# Patient Record
Sex: Female | Born: 1975 | Race: White | Hispanic: No | Marital: Married | Smoking: Never smoker
Health system: Southern US, Community
[De-identification: ages and names within clinical notes are randomized; demographics above are authoritative.]

## PROBLEM LIST (undated history)

## (undated) DIAGNOSIS — E559 Vitamin D deficiency, unspecified: Secondary | ICD-10-CM

## (undated) DIAGNOSIS — Z87440 Personal history of urinary (tract) infections: Secondary | ICD-10-CM

## (undated) DIAGNOSIS — I10 Essential (primary) hypertension: Secondary | ICD-10-CM

## (undated) DIAGNOSIS — T7840XA Allergy, unspecified, initial encounter: Secondary | ICD-10-CM

## (undated) DIAGNOSIS — B019 Varicella without complication: Secondary | ICD-10-CM

## (undated) DIAGNOSIS — M1990 Unspecified osteoarthritis, unspecified site: Secondary | ICD-10-CM

## (undated) HISTORY — DX: Allergy, unspecified, initial encounter: T78.40XA

## (undated) HISTORY — DX: Unspecified osteoarthritis, unspecified site: M19.90

## (undated) HISTORY — DX: Personal history of urinary (tract) infections: Z87.440

## (undated) HISTORY — DX: Varicella without complication: B01.9

## (undated) HISTORY — DX: Essential (primary) hypertension: I10

## (undated) HISTORY — PX: WISDOM TOOTH EXTRACTION: SHX21

## (undated) HISTORY — DX: Vitamin D deficiency, unspecified: E55.9

---

## 1994-12-05 HISTORY — PX: TONSILLECTOMY AND ADENOIDECTOMY: SUR1326

## 2013-12-05 HISTORY — PX: ABDOMINAL HYSTERECTOMY: SHX81

## 2018-12-13 ENCOUNTER — Other Ambulatory Visit: Payer: Self-pay | Admitting: Physical Medicine and Rehabilitation

## 2018-12-13 DIAGNOSIS — M47812 Spondylosis without myelopathy or radiculopathy, cervical region: Secondary | ICD-10-CM

## 2018-12-25 ENCOUNTER — Other Ambulatory Visit: Payer: Self-pay

## 2018-12-25 ENCOUNTER — Ambulatory Visit
Admission: RE | Admit: 2018-12-25 | Discharge: 2018-12-25 | Disposition: A | Discharge status: Home / Self Care | Payer: BLUE CROSS/BLUE SHIELD | Source: Ambulatory Visit | Attending: Physical Medicine and Rehabilitation | Admitting: Physical Medicine and Rehabilitation

## 2018-12-25 DIAGNOSIS — M47812 Spondylosis without myelopathy or radiculopathy, cervical region: Secondary | ICD-10-CM

## 2018-12-25 MED ORDER — IOPAMIDOL (ISOVUE-M 300) INJECTION 61%
1.0000 mL | Freq: Once | INTRAMUSCULAR | Status: AC | PRN
  Administered 2018-12-25: 1 mL via EPIDURAL

## 2018-12-25 MED ORDER — TRIAMCINOLONE ACETONIDE 40 MG/ML IJ SUSP (RADIOLOGY)
60.0000 mg | Freq: Once | INTRAMUSCULAR | Status: AC
  Administered 2018-12-25: 60 mg via EPIDURAL

## 2018-12-25 NOTE — Discharge Instructions (Signed)

## 2019-02-04 DIAGNOSIS — Z72820 Sleep deprivation: Secondary | ICD-10-CM | POA: Diagnosis not present

## 2019-02-04 DIAGNOSIS — N951 Menopausal and female climacteric states: Secondary | ICD-10-CM | POA: Diagnosis not present

## 2019-02-04 DIAGNOSIS — R5383 Other fatigue: Secondary | ICD-10-CM | POA: Diagnosis not present

## 2019-02-04 DIAGNOSIS — E559 Vitamin D deficiency, unspecified: Secondary | ICD-10-CM | POA: Diagnosis not present

## 2019-02-12 ENCOUNTER — Other Ambulatory Visit: Payer: Self-pay | Admitting: Physical Medicine and Rehabilitation

## 2019-02-12 DIAGNOSIS — M542 Cervicalgia: Secondary | ICD-10-CM

## 2019-02-22 ENCOUNTER — Other Ambulatory Visit: Payer: BLUE CROSS/BLUE SHIELD

## 2019-03-04 DIAGNOSIS — N951 Menopausal and female climacteric states: Secondary | ICD-10-CM | POA: Diagnosis not present

## 2019-03-04 DIAGNOSIS — R6882 Decreased libido: Secondary | ICD-10-CM | POA: Diagnosis not present

## 2019-03-04 DIAGNOSIS — Z72820 Sleep deprivation: Secondary | ICD-10-CM | POA: Diagnosis not present

## 2019-03-06 DIAGNOSIS — Z72820 Sleep deprivation: Secondary | ICD-10-CM | POA: Diagnosis not present

## 2019-03-06 DIAGNOSIS — N951 Menopausal and female climacteric states: Secondary | ICD-10-CM | POA: Diagnosis not present

## 2019-03-06 DIAGNOSIS — R5383 Other fatigue: Secondary | ICD-10-CM | POA: Diagnosis not present

## 2019-03-26 ENCOUNTER — Other Ambulatory Visit: Payer: BLUE CROSS/BLUE SHIELD

## 2019-05-28 ENCOUNTER — Other Ambulatory Visit: Payer: Self-pay

## 2019-05-28 ENCOUNTER — Ambulatory Visit
Admission: RE | Admit: 2019-05-28 | Discharge: 2019-05-28 | Disposition: A | Discharge status: Home / Self Care | Payer: BLUE CROSS/BLUE SHIELD | Source: Ambulatory Visit | Attending: Physical Medicine and Rehabilitation | Admitting: Physical Medicine and Rehabilitation

## 2019-05-28 DIAGNOSIS — M47812 Spondylosis without myelopathy or radiculopathy, cervical region: Secondary | ICD-10-CM | POA: Diagnosis not present

## 2019-05-28 DIAGNOSIS — M542 Cervicalgia: Secondary | ICD-10-CM

## 2019-05-28 MED ORDER — TRIAMCINOLONE ACETONIDE 40 MG/ML IJ SUSP (RADIOLOGY)
60.0000 mg | Freq: Once | INTRAMUSCULAR | Status: AC
  Administered 2019-05-28: 60 mg via EPIDURAL

## 2019-05-28 MED ORDER — IOPAMIDOL (ISOVUE-M 300) INJECTION 61%
1.0000 mL | Freq: Once | INTRAMUSCULAR | Status: AC
  Administered 2019-05-28: 1 mL via EPIDURAL

## 2019-05-31 DIAGNOSIS — R5383 Other fatigue: Secondary | ICD-10-CM | POA: Diagnosis not present

## 2019-05-31 DIAGNOSIS — Z72820 Sleep deprivation: Secondary | ICD-10-CM | POA: Diagnosis not present

## 2019-05-31 DIAGNOSIS — N951 Menopausal and female climacteric states: Secondary | ICD-10-CM | POA: Diagnosis not present

## 2019-05-31 DIAGNOSIS — E559 Vitamin D deficiency, unspecified: Secondary | ICD-10-CM | POA: Diagnosis not present

## 2019-06-04 DIAGNOSIS — N951 Menopausal and female climacteric states: Secondary | ICD-10-CM | POA: Diagnosis not present

## 2019-06-04 DIAGNOSIS — R6882 Decreased libido: Secondary | ICD-10-CM | POA: Diagnosis not present

## 2019-06-04 DIAGNOSIS — E559 Vitamin D deficiency, unspecified: Secondary | ICD-10-CM | POA: Diagnosis not present

## 2019-06-04 DIAGNOSIS — Z72820 Sleep deprivation: Secondary | ICD-10-CM | POA: Diagnosis not present

## 2019-06-10 DIAGNOSIS — N993 Prolapse of vaginal vault after hysterectomy: Secondary | ICD-10-CM | POA: Diagnosis not present

## 2019-06-10 DIAGNOSIS — N393 Stress incontinence (female) (male): Secondary | ICD-10-CM | POA: Diagnosis not present

## 2019-06-25 DIAGNOSIS — N393 Stress incontinence (female) (male): Secondary | ICD-10-CM | POA: Diagnosis not present

## 2019-07-08 DIAGNOSIS — K5900 Constipation, unspecified: Secondary | ICD-10-CM | POA: Diagnosis not present

## 2019-07-08 DIAGNOSIS — R3915 Urgency of urination: Secondary | ICD-10-CM | POA: Diagnosis not present

## 2019-07-08 DIAGNOSIS — N393 Stress incontinence (female) (male): Secondary | ICD-10-CM | POA: Diagnosis not present

## 2019-09-03 DIAGNOSIS — R5383 Other fatigue: Secondary | ICD-10-CM | POA: Diagnosis not present

## 2019-09-03 DIAGNOSIS — N951 Menopausal and female climacteric states: Secondary | ICD-10-CM | POA: Diagnosis not present

## 2019-09-03 DIAGNOSIS — R6882 Decreased libido: Secondary | ICD-10-CM | POA: Diagnosis not present

## 2019-09-03 DIAGNOSIS — Z72820 Sleep deprivation: Secondary | ICD-10-CM | POA: Diagnosis not present

## 2019-09-05 DIAGNOSIS — R6882 Decreased libido: Secondary | ICD-10-CM | POA: Diagnosis not present

## 2019-09-05 DIAGNOSIS — L709 Acne, unspecified: Secondary | ICD-10-CM | POA: Diagnosis not present

## 2019-09-05 DIAGNOSIS — N951 Menopausal and female climacteric states: Secondary | ICD-10-CM | POA: Diagnosis not present

## 2019-09-05 DIAGNOSIS — R5383 Other fatigue: Secondary | ICD-10-CM | POA: Diagnosis not present

## 2019-09-07 DIAGNOSIS — Z20828 Contact with and (suspected) exposure to other viral communicable diseases: Secondary | ICD-10-CM | POA: Diagnosis not present

## 2019-10-02 ENCOUNTER — Ambulatory Visit: Payer: BC Managed Care – PPO | Admitting: Family Medicine

## 2019-10-02 ENCOUNTER — Other Ambulatory Visit: Payer: Self-pay

## 2019-10-02 ENCOUNTER — Encounter: Payer: Self-pay | Admitting: Family Medicine

## 2019-10-02 VITALS — BP 174/102 | HR 68 | Temp 98.6°F | Resp 17 | Ht 68.5 in | Wt 193.5 lb

## 2019-10-02 DIAGNOSIS — Z131 Encounter for screening for diabetes mellitus: Secondary | ICD-10-CM

## 2019-10-02 DIAGNOSIS — Z13220 Encounter for screening for lipoid disorders: Secondary | ICD-10-CM

## 2019-10-02 DIAGNOSIS — R030 Elevated blood-pressure reading, without diagnosis of hypertension: Secondary | ICD-10-CM

## 2019-10-02 DIAGNOSIS — R635 Abnormal weight gain: Secondary | ICD-10-CM

## 2019-10-02 DIAGNOSIS — Z130 Encounter for screening for diseases of the blood and blood-forming organs and certain disorders involving the immune mechanism: Secondary | ICD-10-CM

## 2019-10-02 DIAGNOSIS — Z0000 Encounter for general adult medical examination without abnormal findings: Secondary | ICD-10-CM | POA: Diagnosis not present

## 2019-10-02 DIAGNOSIS — Z79890 Hormone replacement therapy (postmenopausal): Secondary | ICD-10-CM

## 2019-10-02 DIAGNOSIS — Z23 Encounter for immunization: Secondary | ICD-10-CM

## 2019-10-02 DIAGNOSIS — E559 Vitamin D deficiency, unspecified: Secondary | ICD-10-CM | POA: Diagnosis not present

## 2019-10-02 LAB — COMPREHENSIVE METABOLIC PANEL
ALT: 20 U/L (ref 0–35)
AST: 21 U/L (ref 0–37)
Albumin: 4.5 g/dL (ref 3.5–5.2)
Alkaline Phosphatase: 51 U/L (ref 39–117)
BUN: 15 mg/dL (ref 6–23)
CO2: 24 mEq/L (ref 19–32)
Calcium: 9.3 mg/dL (ref 8.4–10.5)
Chloride: 103 mEq/L (ref 96–112)
Creatinine, Ser: 0.77 mg/dL (ref 0.40–1.20)
GFR: 81.3 mL/min (ref >60.00)
Glucose, Bld: 75 mg/dL (ref 70–99)
Potassium: 4.3 mEq/L (ref 3.5–5.1)
Sodium: 137 mEq/L (ref 135–145)
Total Bilirubin: 1.7 mg/dL — ABNORMAL HIGH (ref 0.2–1.2)
Total Protein: 7 g/dL (ref 6.0–8.3)

## 2019-10-02 LAB — LIPID PANEL
Cholesterol: 281 mg/dL — ABNORMAL HIGH (ref 0–200)
HDL: 67.6 mg/dL (ref >39.00)
LDL Cholesterol: 201 mg/dL — ABNORMAL HIGH (ref 0–99)
NonHDL: 212.93
Total CHOL/HDL Ratio: 4
Triglycerides: 61 mg/dL (ref 0.0–149.0)
VLDL: 12.2 mg/dL (ref 0.0–40.0)

## 2019-10-02 LAB — CBC
HCT: 44.3 % (ref 36.0–46.0)
Hemoglobin: 14.9 g/dL (ref 12.0–15.0)
MCHC: 33.7 g/dL (ref 30.0–36.0)
MCV: 93.7 fl (ref 78.0–100.0)
Platelets: 278 10*3/uL (ref 150.0–400.0)
RBC: 4.73 Mil/uL (ref 3.87–5.11)
RDW: 13.3 % (ref 11.5–15.5)
WBC: 7.1 10*3/uL (ref 4.0–10.5)

## 2019-10-02 NOTE — Progress Notes (Signed)
Patient ID: Tammy Estes, female  DOB: January 31, 1976, 43 y.o.   MRN: [id number] Patient Care Team    Relationship Specialty Notifications Start End  Ma Hillock, DO PCP - General Family Medicine  10/02/19   Rockey Situ  Obstetrics and Gynecology  10/02/19     Chief Complaint  Patient presents with  . Establish Care    Prior PCP [facility] in [facility] [place] [facility], [place].   [facility] for neck injury. Pt has not had CPE in a year. Pt has not had pap since hysterectomy and has not had mammogram.     Subjective:  Tammy Estes is a 43 y.o.  female present for new patient establishment. All past medical history, surgical history, allergies, family history, immunizations, medications and social history were updated in the electronic medical record today. All recent labs, ED visits and hospitalizations within the last year were reviewed.  Health maintenance:  Colonoscopy: No fhx. Routine screen at 49 Mammogram: pt declined screening. She is on HRT therapy through [facility]- discussed it is recommended she have yearly mammograms, esp. While on HRT therapy d/y increased potential risk.  Cervical cancer screening: hysterectomy for adenomyosis and endometriosis. She had a pelvic exam with urogyn. 07/08/2019  Immunizations: tdap update today, Influenza declined (encouraged yearly) Infectious disease screening: HIV completed DEXA: routine screen.  Assistive device: none Oxygen VOH:YWVP Patient has a Dental home. Hospitalizations/ED visits:reviewed  Depression screen [facility] 2/9 10/02/2019  Decreased Interest 0  Down, Depressed, Hopeless 0  PHQ - 2 Score 0   No flowsheet data found.     No flowsheet data found. Immunization History  Administered Date(s) Administered  . Tdap 10/02/2019    No exam data present  Past Medical History:  Diagnosis Date  . Allergy   . Arthritis   . Chicken pox   . History of frequent urinary tract infections   .  Hypertension   . Vitamin D deficiency    No Known Allergies Past Surgical History:  Procedure Laterality Date  . ABDOMINAL HYSTERECTOMY  2015   partial for adenomyosis and endometriosis  . TONSILLECTOMY AND ADENOIDECTOMY  1996  . WISDOM TOOTH EXTRACTION     Family History  Problem Relation Age of Onset  . Stroke Father   . Heart disease Father   . Dementia Father   . Hypertension Father   . Hyperlipidemia Father   . Diabetes Father   . Autism Brother   . Hyperlipidemia Maternal Grandmother   . Hypertension Maternal Grandmother   . Stroke Maternal Grandfather   . Parkinson's disease Maternal Grandfather   . Pancreatic cancer Maternal Grandfather   . Stroke Paternal Grandmother   . Heart attack Paternal Grandmother    Social History   Social History Narrative   Marital status/children/pets: married, 2 children.    Education/employment: employeed, realtor   Safety:      -smoke alarm in the home:Yes     - wears seatbelt: Yes     - Feels safe in their relationships: Yes    Allergies as of 10/02/2019   No Known Allergies     Medication List       Accurate as of October 02, 2019 12:36 PM. If you have any questions, ask your nurse or doctor.        Collagen Hydrolysate (Bovine) Powd by Does not apply route.   Magnesium 200 MG Tabs Take 1 tablet by mouth.   progesterone 200 MG capsule Commonly  known as: PROMETRIUM Take 225 mg by mouth daily.   spironolactone 25 MG tablet Commonly known as: ALDACTONE Take 25 mg by mouth daily.   Testosterone 100 MG Pllt 125 mg by Implant route.   Vitamin D 125 MCG (5000 UT) Caps Take 1 tablet by mouth.   WOMENS MULTI VITAMIN & MINERAL PO Take by mouth.   HAIR VITAMINS PO Take 1 tablet by mouth.       All past medical history, surgical history, allergies, family history, immunizations andmedications were updated in the EMR today and reviewed under the history and medication portions of their EMR.    No results  found for this or any previous visit (from the past 2160 hour(s)).  ROS: 14 pt review of systems performed and negative (unless mentioned in an HPI)  Objective: BP (!) 174/102 (BP Location: Right Arm, Patient Position: Sitting, Cuff Size: Normal)   Pulse 68   Temp 98.6 F (37 C) (Temporal)   Resp 17   Ht 5' 8.5" (1.74 m)   Wt 193 lb 8 oz (87.8 kg)   SpO2 100%   BMI 28.99 kg/m  Gen: Afebrile. No acute distress. Nontoxic in appearance, well-developed, well-nourished, pleasant overweight female.  HENT: AT. [place]. Bilateral TM visualized and normal in appearance, normal external auditory canal. MMM, no oral lesions, adequate dentition. Bilateral nares within normal limits. Throat without erythema, ulcerations or exudates. no Cough on exam, no hoarseness on exam. Eyes:Pupils Equal Round Reactive to light, Extraocular movements intact,  Conjunctiva without redness, discharge or icterus. Neck/lymp/endocrine: Supple,no lymphadenopathy, no thyromegaly CV: RRR no murmur, no edema, +2/4 P posterior tibialis pulses. no carotid bruits. No JVD. Chest: CTAB, no wheeze, rhonchi or crackles. normal Respiratory effort. good Air movement. Abd: Soft. flat. NTND. BS present. no Masses palpated. No hepatosplenomegaly. No rebound tenderness or guarding. Skin: no rashes, purpura or petechiae. Warm and well-perfused. Skin intact. Neuro/Msk: Normal gait. PERLA. EOMi. Alert. Oriented x3.  Cranial nerves II through XII intact. Muscle strength 5/5 upper/lower extremity. DTRs equal bilaterally. Psych: Normal affect, dress and demeanor. Normal speech. Normal thought content and judgment.   Assessment/plan: Tammy Estes is a 43 y.o. female present for est/cpe Vitamin D deficiency Taking vit d 5000u daily.  - Vitamin D (25 hydroxy Screening for deficiency anemia - CBC Diabetes mellitus screening - Hemoglobin A1c Screening cholesterol level - Lipid panel Hormone replacement therapy (HRT)/Weight gain - HRT  managed by [facility].  - Continue to working on diet and exercise. Reports about a 20lb weight gain with covid.  - CBC - Comp Met (CMET) - TSH - Lipid panel  Need for tetanus booster - Tdap vaccine greater than or equal to 7yo IM  Elevated BP without diagnosis of hypertension Pt reports she has WCS and BP is always high at appts. She was once placed on a BP med and then had hypotension/syncope.  Monitor at home, if > 135/85 routinely would need to have her follow up and consider restart of med. Await her records to review history and prior meds  Encounter for preventive health examination Patient was encouraged to exercise greater than 150 minutes a week. Patient was encouraged to choose a diet filled with fresh fruits and vegetables, and lean meats. AVS provided to patient today for education/recommendation on gender specific health and safety maintenance. Colonoscopy: No fhx. Routine screen at 49 Mammogram: pt declined screening. She is on H[facility] therapy [facility]- discussed it is recommended she have yearly mammograms, esp.  While on HRT therapy d/y increased potential risk.  Cervical cancer screening: hysterectomy for adenomyosis and endometriosis. She had a pelvic exam with urogyn. 07/08/2019  Immunizations: tdap update today, Influenza declined (encouraged yearly) Infectious disease screening: HIV completed DEXA: routine screen.   Return in about 1 year (around 10/01/2020) for CPE (30 min).   Note is dictated utilizing voice recognition software. Although note has been proof read prior to signing, occasional typographical errors still can be missed. If any questions arise, please do not hesitate to call for verification.  Electronically signed by: Howard Pouch, DO [place]

## 2019-10-02 NOTE — Patient Instructions (Signed)
Dr. Oswaldo Conroy in [place] is a local eye doctor.   Health Maintenance, Female Adopting a healthy lifestyle and getting preventive care are important in promoting health and wellness. Ask your health care provider about:  The right schedule for you to have regular tests and exams.  Things you can do on your own to prevent diseases and keep yourself healthy. What should I know about diet, weight, and exercise? Eat a healthy diet   Eat a diet that includes plenty of vegetables, fruits, low-fat dairy products, and lean protein.  Do not eat a lot of foods that are high in solid fats, added sugars, or sodium. Maintain a healthy weight Body mass index (BMI) is used to identify weight problems. It estimates body fat based on height and weight. Your health care provider can help determine your BMI and help you achieve or maintain a healthy weight. Get regular exercise Get regular exercise. This is one of the most important things you can do for your health. Most adults should:  Exercise for at least 150 minutes each week. The exercise should increase your heart rate and make you sweat (moderate-intensity exercise).  Do strengthening exercises at least twice a week. This is in addition to the moderate-intensity exercise.  Spend less time sitting. Even light physical activity can be beneficial. Watch cholesterol and blood lipids Have your blood tested for lipids and cholesterol at 44 years of age, then have this test every 5 years. Have your cholesterol levels checked more often if:  Your lipid or cholesterol levels are high.  You are older than 44 years of age.  You are at high risk for heart disease. What should I know about cancer screening? Depending on your health history and family history, you may need to have cancer screening at various ages. This may include screening for:  Breast cancer.  Cervical cancer.  Colorectal cancer.  Skin cancer.  Lung cancer. What should I know  about heart disease, diabetes, and high blood pressure? Blood pressure and heart disease  High blood pressure causes heart disease and increases the risk of stroke. This is more likely to develop in people who have high blood pressure readings, are of African descent, or are overweight.  Have your blood pressure checked: ? Every 3-5 years if you are 62-21 years of age. ? Every year if you are 1 years old or older. Diabetes Have regular diabetes screenings. This checks your fasting blood sugar level. Have the screening done:  Once every three years after age 43 if you are at a normal weight and have a low risk for diabetes.  More often and at a younger age if you are overweight or have a high risk for diabetes. What should I know about preventing infection? Hepatitis B If you have a higher risk for hepatitis B, you should be screened for this virus. Talk with your health care provider to find out if you are at risk for hepatitis B infection. Hepatitis C Testing is recommended for:  Everyone born from [birth year] through 1966.  Anyone with known risk factors for hepatitis C. Sexually transmitted infections (STIs)  Get screened for STIs, including gonorrhea and chlamydia, if: ? You are sexually active and are younger than 44 years of age. ? You are older than 44 years of age and your health care provider tells you that you are at risk for this type of infection. ? Your sexual activity has changed since you were last screened, and you are at increased  risk for chlamydia or gonorrhea. Ask your health care provider if you are at risk.  Ask your health care provider about whether you are at high risk for HIV. Your health care provider may recommend a prescription medicine to help prevent HIV infection. If you choose to take medicine to prevent HIV, you should first get tested for HIV. You should then be tested every 3 months for as long as you are taking the medicine. Pregnancy  If you are about  to stop having your period (premenopausal) and you may become pregnant, seek counseling before you get pregnant.  Take 400 to 800 micrograms (mcg) of folic acid every day if you become pregnant.  Ask for birth control (contraception) if you want to prevent pregnancy. Osteoporosis and menopause Osteoporosis is a disease in which the bones lose minerals and strength with aging. This can result in bone fractures. If you are 13 years old or older, or if you are at risk for osteoporosis and fractures, ask your health care provider if you should:  Be screened for bone loss.  Take a calcium or vitamin D supplement to lower your risk of fractures.  Be given hormone replacement therapy (HRT) to treat symptoms of menopause. Follow these instructions at home: Lifestyle  Do not use any products that contain nicotine or tobacco, such as cigarettes, e-cigarettes, and chewing tobacco. If you need help quitting, ask your health care provider.  Do not use street drugs.  Do not share needles.  Ask your health care provider for help if you need support or information about quitting drugs. Alcohol use  Do not drink alcohol if: ? Your health care provider tells you not to drink. ? You are pregnant, may be pregnant, or are planning to become pregnant.  If you drink alcohol: ? Limit how much you use to 0-1 drink a day. ? Limit intake if you are breastfeeding.  Be aware of how much alcohol is in your drink. In the [place] one drink equals one 12 oz bottle of beer (355 mL), one 5 oz glass of wine (148 mL), or one 1 oz glass of hard liquor (44 mL). General instructions  Schedule regular health, dental, and eye exams.  Stay current with your vaccines.  Tell your health care provider if: ? You often feel depressed. ? You have ever been abused or do not feel safe at home. Summary  Adopting a healthy lifestyle and getting preventive care are important in promoting health and wellness.  Follow your  health care provider's instructions about healthy diet, exercising, and getting tested or screened for diseases.  Follow your health care provider's instructions on monitoring your cholesterol and blood pressure. This information is not intended to replace advice given to you by your health care provider. Make sure you discuss any questions you have with your health care provider. Document Released: 06/06/2011 Document Revised: 11/14/2018 Document Reviewed: 11/14/2018 Elsevier Patient Education  2020 [employer].

## 2019-10-03 LAB — HEMOGLOBIN A1C: Hgb A1c MFr Bld: 5.2 % (ref 4.6–6.5)

## 2019-10-03 LAB — VITAMIN D 25 HYDROXY (VIT D DEFICIENCY, FRACTURES): VITD: 41.52 ng/mL (ref 30.00–100.00)

## 2019-10-03 LAB — TSH: TSH: 0.97 u[IU]/mL (ref 0.35–4.50)

## 2019-10-04 ENCOUNTER — Telehealth: Payer: Self-pay | Admitting: Family Medicine

## 2019-10-04 DIAGNOSIS — E785 Hyperlipidemia, unspecified: Secondary | ICD-10-CM

## 2019-10-04 DIAGNOSIS — R17 Unspecified jaundice: Secondary | ICD-10-CM

## 2019-10-04 MED ORDER — ATORVASTATIN CALCIUM 40 MG PO TABS: 40.0000 mg | ORAL_TABLET | Freq: Every day | ORAL | 3 refills | Status: DC

## 2019-10-04 NOTE — Telephone Encounter (Signed)
Please inform patient the following information: -Blood counts, vitamin D, thyroid and diabetes/A1c levels are all normal. -Her bilirubin was elevated, this is sometimes commonly seen in people who are fasting. I do not have any prior labs to compare. Has she been told this in the past? Repeat bilirubin lab in 2 weeks (lab appointment only)>> just must EAT before lab collected, fasting can cause elevation in some people>> if it corrects once eating it is not a concern.  -Her cholesterol is extremely elevated with a total cholesterol of 281 (goal less than 200), HDL/good cholesterol at 67 which is great, and her LDL/bad cholesterol which is extremely elevated at 201  (goal at least less than 130). This puts her at a higher risk for heart attack and stroke. With her family history in the elevated LDL, it is strongly encouraged for her to start a statin medication.  This medication brings down the cholesterol and it also provide some cardiovascular protection against stroke and heart attack.  I have called this in for her to start to take nightly.  Follow-up in 12 weeks, fasting appointment with provider for recheck.   Increasing exercise and following a Mediterranean diet can also be helpful.  Lastly, I would like her to monitor her blood pressure at home and if routinely seeing greater than 135/85 to call and make [place] aware, we would then start a blood pressure medication as well.

## 2019-10-04 NOTE — Telephone Encounter (Signed)
Pt was called and given all information and results. She agreed to start Statin and take BP at home. Lab appt was scheduled but patient said she would have to call back for the 12 week fasting appt.

## 2019-10-06 DIAGNOSIS — R17 Unspecified jaundice: Secondary | ICD-10-CM

## 2019-10-06 HISTORY — DX: Unspecified jaundice: R17

## 2019-10-17 ENCOUNTER — Ambulatory Visit (INDEPENDENT_AMBULATORY_CARE_PROVIDER_SITE_OTHER): Payer: BC Managed Care – PPO

## 2019-10-17 ENCOUNTER — Other Ambulatory Visit: Payer: Self-pay

## 2019-10-17 DIAGNOSIS — R17 Unspecified jaundice: Secondary | ICD-10-CM | POA: Diagnosis not present

## 2019-10-18 LAB — BILIRUBIN, FRACTIONATED(TOT/DIR/INDIR)
Bilirubin, Direct: 0.2 mg/dL (ref 0.0–0.2)
Indirect Bilirubin: 1 mg/dL (calc) (ref 0.2–1.2)
Total Bilirubin: 1.2 mg/dL (ref 0.2–1.2)

## 2019-10-21 ENCOUNTER — Encounter: Payer: Self-pay | Admitting: Family Medicine

## 2019-12-09 DIAGNOSIS — R6882 Decreased libido: Secondary | ICD-10-CM | POA: Diagnosis not present

## 2019-12-09 DIAGNOSIS — R5383 Other fatigue: Secondary | ICD-10-CM | POA: Diagnosis not present

## 2019-12-09 DIAGNOSIS — N951 Menopausal and female climacteric states: Secondary | ICD-10-CM | POA: Diagnosis not present

## 2019-12-11 DIAGNOSIS — N951 Menopausal and female climacteric states: Secondary | ICD-10-CM | POA: Diagnosis not present

## 2019-12-11 DIAGNOSIS — Z72820 Sleep deprivation: Secondary | ICD-10-CM | POA: Diagnosis not present

## 2019-12-11 DIAGNOSIS — R5383 Other fatigue: Secondary | ICD-10-CM | POA: Diagnosis not present

## 2020-02-14 DIAGNOSIS — M79602 Pain in left arm: Secondary | ICD-10-CM | POA: Diagnosis not present

## 2020-02-14 DIAGNOSIS — M5412 Radiculopathy, cervical region: Secondary | ICD-10-CM | POA: Diagnosis not present

## 2020-02-14 DIAGNOSIS — M79601 Pain in right arm: Secondary | ICD-10-CM | POA: Diagnosis not present

## 2020-02-18 ENCOUNTER — Ambulatory Visit: Payer: BC Managed Care – PPO | Admitting: Family Medicine

## 2020-02-18 ENCOUNTER — Other Ambulatory Visit: Payer: Self-pay

## 2020-02-18 ENCOUNTER — Encounter: Payer: Self-pay | Admitting: Family Medicine

## 2020-02-18 VITALS — BP 138/82 | HR 70 | Temp 98.3°F | Resp 16 | Ht 69.0 in | Wt 210.5 lb

## 2020-02-18 DIAGNOSIS — E782 Mixed hyperlipidemia: Secondary | ICD-10-CM

## 2020-02-18 DIAGNOSIS — F418 Other specified anxiety disorders: Secondary | ICD-10-CM | POA: Diagnosis not present

## 2020-02-18 DIAGNOSIS — E785 Hyperlipidemia, unspecified: Secondary | ICD-10-CM | POA: Insufficient documentation

## 2020-02-18 DIAGNOSIS — Z5320 Procedure and treatment not carried out because of patient's decision for unspecified reasons: Secondary | ICD-10-CM | POA: Insufficient documentation

## 2020-02-18 MED ORDER — ESCITALOPRAM OXALATE 10 MG PO TABS: 10.0000 mg | ORAL_TABLET | Freq: Every day | ORAL | 0 refills | Status: DC

## 2020-02-18 NOTE — Patient Instructions (Signed)
Start lexapro daily.  I have referred you to psychology/therapy >> they will call you to schedule.     Major Depressive Disorder, Adult Major depressive disorder (MDD) is a mental health condition. MDD often makes you feel sad, hopeless, or helpless. MDD can also cause symptoms in your body. MDD can affect your:  Work.  School.  Relationships.  Other normal activities. MDD can range from mild to very bad. It may occur once (single episode MDD). It can also occur many times (recurrent MDD). The main symptoms of MDD often include:  Feeling sad, depressed, or irritable most of the time.  Loss of interest. MDD symptoms also include:  Sleeping too much or too little.  Eating too much or too little.  A change in your weight.  Feeling tired (fatigue) or having low energy.  Feeling worthless.  Feeling guilty.  Trouble making decisions.  Trouble thinking clearly.  Thoughts of suicide or harming others.  Feeling weak.  Feeling agitated.  Keeping yourself from being around other people (isolation). Follow these instructions at home: Activity  Do these things as told by your doctor: ? Go back to your normal activities. ? Exercise regularly. ? Spend time outdoors. Alcohol  Talk with your doctor about how alcohol can affect your antidepressant medicines.  Do not drink alcohol. Or, limit how much alcohol you drink. ? This means no more than 1 drink a day for nonpregnant women and 2 drinks a day for men. One drink equals one of these:  12 oz of beer.  5 oz of wine.  1 oz of hard liquor. General instructions  Take over-the-counter and prescription medicines only as told by your doctor.  Eat a healthy diet.  Get plenty of sleep.  Find activities that you enjoy. Make time to do them.  Think about joining a support group. Your doctor may be able to suggest a group for you.  Keep all follow-up visits as told by your doctor. This is important. Where to find  more information:  [employer] on Mental Illness: ? www.nami.org  [place] [employer] of Mental Health: ? http://www.maynard.net/  National Suicide Prevention Lifeline: ? 7705247529. This is free, 24-hour help. Contact a doctor if:  Your symptoms get worse.  You have new symptoms. Get help right away if:  You self-harm.  You see, hear, taste, smell, or feel things that are not present (hallucinate). If you ever feel like you may hurt yourself or others, or have thoughts about taking your own life, get help right away. You can go to your nearest emergency department or call:  Your local emergency services (911 in the [place]  A suicide crisis helpline, such as the National Suicide Prevention Lifeline: ? 830-155-8463. This is open 24 hours a day. This information is not intended to replace advice given to you by your health care provider. Make sure you discuss any questions you have with your health care provider. Document Revised: 11/03/2017 Document Reviewed: 08/07/2016 Elsevier Patient Education  2020 Else[employer] Inc.   Generalized Anxiety Disorder, Adult Generalized anxiety disorder (GAD) is a mental health disorder. People with this condition constantly worry about everyday events. Unlike normal anxiety, worry related to GAD is not triggered by a specific event. These worries also do not fade or get better with time. GAD interferes with life functions, including relationships, work, and school. GAD can vary from mild to severe. People with severe GAD can have intense waves of anxiety with physical symptoms (panic attacks). What are the  causes? The exact cause of GAD is not known. What increases the risk? This condition is more likely to develop in:  Women.  People who have a family history of anxiety disorders.  People who are very shy.  People who experience very stressful life events, such as the death of a loved one.  People who have a very stressful family  environment. What are the signs or symptoms? People with GAD often worry excessively about many things in their lives, such as their health and family. They may also be overly concerned about:  Doing well at work.  Being on time.  Natural disasters.  Friendships. Physical symptoms of GAD include:  Fatigue.  Muscle tension or having muscle twitches.  Trembling or feeling shaky.  Being easily startled.  Feeling like your heart is pounding or racing.  Feeling out of breath or like you cannot take a deep breath.  Having trouble falling asleep or staying asleep.  Sweating.  Nausea, diarrhea, or irritable bowel syndrome (IBS).  Headaches.  Trouble concentrating or remembering facts.  Restlessness.  Irritability. How is this diagnosed? Your health care provider can diagnose GAD based on your symptoms and medical history. You will also have a physical exam. The health care provider will ask specific questions about your symptoms, including how severe they are, when they started, and if they come and go. Your health care provider may ask you about your use of alcohol or drugs, including prescription medicines. Your health care provider may refer you to a mental health specialist for further evaluation. Your health care provider will do a thorough examination and may perform additional tests to rule out other possible causes of your symptoms. To be diagnosed with GAD, a person must have anxiety that:  Is out of his or her control.  Affects several different aspects of his or her life, such as work and relationships.  Causes distress that makes him or her unable to take part in normal activities.  Includes at least three physical symptoms of GAD, such as restlessness, fatigue, trouble concentrating, irritability, muscle tension, or sleep problems. Before your health care provider can confirm a diagnosis of GAD, these symptoms must be present more days than they are not, and  they must last for six months or longer. How is this treated? The following therapies are usually used to treat GAD:  Medicine. Antidepressant medicine is usually prescribed for long-term daily control. Antianxiety medicines may be added in severe cases, especially when panic attacks occur.  Talk therapy (psychotherapy). Certain types of talk therapy can be helpful in treating GAD by providing support, education, and guidance. Options include: ? Cognitive behavioral therapy (CBT). People learn coping skills and techniques to ease their anxiety. They learn to identify unrealistic or negative thoughts and behaviors and to replace them with positive ones. ? Acceptance and commitment therapy (ACT). This treatment teaches people how to be mindful as a way to cope with unwanted thoughts and feelings. ? Biofeedback. This process trains you to manage your body's response (physiological response) through breathing techniques and relaxation methods. You will work with a therapist while machines are used to monitor your physical symptoms.  Stress management techniques. These include yoga, meditation, and exercise. A mental health specialist can help determine which treatment is best for you. Some people see improvement with one type of therapy. However, other people require a combination of therapies. Follow these instructions at home:  Take over-the-counter and prescription medicines only as told by your health care  provider.  Try to maintain a normal routine.  Try to anticipate stressful situations and allow extra time to manage them.  Practice any stress management or self-calming techniques as taught by your health care provider.  Do not punish yourself for setbacks or for not making progress.  Try to recognize your accomplishments, even if they are small.  Keep all follow-up visits as told by your health care provider. This is important. Contact a health care provider if:  Your symptoms do  not get better.  Your symptoms get worse.  You have signs of depression, such as: ? A persistently sad, cranky, or irritable mood. ? Loss of enjoyment in activities that used to bring you joy. ? Change in weight or eating. ? Changes in sleeping habits. ? Avoiding friends or family members. ? Loss of energy for normal tasks. ? Feelings of guilt or worthlessness. Get help right away if:  You have serious thoughts about hurting yourself or others. If you ever feel like you may hurt yourself or others, or have thoughts about taking your own life, get help right away. You can go to your nearest emergency department or call:  Your local emergency services (911 in the [place]  A suicide crisis helpline, such as the [employer] at 539-588-9305. This is open 24 hours a day. Summary  Generalized anxiety disorder (GAD) is a mental health disorder that involves worry that is not triggered by a specific event.  People with GAD often worry excessively about many things in their lives, such as their health and family.  GAD may cause physical symptoms such as restlessness, trouble concentrating, sleep problems, frequent sweating, nausea, diarrhea, headaches, and trembling or muscle twitching.  A mental health specialist can help determine which treatment is best for you. Some people see improvement with one type of therapy. However, other people require a combination of therapies. This information is not intended to replace advice given to you by your health care provider. Make sure you discuss any questions you have with your health care provider. Document Revised: 11/03/2017 Document Reviewed: 10/11/2016 Elsevier Patient Education  2020 [employer].

## 2020-02-18 NOTE — Progress Notes (Signed)
This visit occurred during the SARS-CoV-2 public health emergency.  Safety protocols were in place, including screening questions prior to the visit, additional usage of staff PPE, and extensive cleaning of exam room while observing appropriate contact time as indicated for disinfecting solutions.    Tammy Estes , 1975/12/11, 44 y.o., female MRN: [id number] Patient Care Team    Relationship Specialty Notifications Start End  Natalia Leatherwood, DO PCP - General Family Medicine  10/02/19   Karleen Dolphin  Obstetrics and Gynecology  10/02/19     Chief Complaint  Patient presents with  . Anxiety    Pt has been having anxiety all her adult life, worsened recently. Neck pain has increasned due to anxiety. Getting MRI next week for neck.      Subjective: Tammy Estes is a 44 y.o.  Female Pt presents for an OV with complaints of anxiety.  Patient reports she has had anxiety for most of her adult life.  She has not been prescribed medications for her anxiety in the past and always had felt like she would attempt to cope with her anxiety.  However she has a lot of increased rest at this time and does not feel she is able to cope with it on her own.  She moved to a new area/date in the middle of a pandemic.  Her son is having signs of depression and she has concerns for him.  She reports she is having more neck pain secondary to the tension in the stress.  She is under care for her neck pain through her specialist.  Depression screen [facility] 2/9 02/18/2020 10/02/2019  Decreased Interest 3 0  Down, Depressed, Hopeless 3 0  PHQ - 2 Score 6 0  Altered sleeping 3 -  Tired, decreased energy 3 -  Change in appetite 3 -  Feeling bad or failure about yourself  3 -  Trouble concentrating 3 -  Moving slowly or fidgety/restless 3 -  Suicidal thoughts 0 -  PHQ-9 Score 24 -  Difficult doing work/chores Very difficult -   GAD 7 : Generalized Anxiety Score 02/18/2020  Nervous, Anxious, on  Edge 3  Control/stop worrying 0  Worry too much - different things 3  Trouble relaxing 3  Restless 0  Easily annoyed or irritable 2  Afraid - awful might happen 0  Total GAD 7 Score 11  Anxiety Difficulty Not difficult at all   No Known Allergies Social History   Social History Narrative   Marital status/children/pets: married, 2 children.    Education/employment: employeed, realtor   Safety:      -smoke alarm in the home:Yes     - wears seatbelt: Yes     - Feels safe in their relationships: Yes   Past Medical History:  Diagnosis Date  . Allergy   . Arthritis   . Chicken pox   . Elevated bilirubin 10/2019   with fasting labs only>> rpt normal.   . History of frequent urinary tract infections   . Hypertension   . Vitamin D deficiency    Past Surgical History:  Procedure Laterality Date  . ABDOMINAL HYSTERECTOMY  2015   partial for adenomyosis and endometriosis  . TONSILLECTOMY AND ADENOIDECTOMY  1996  . WISDOM TOOTH EXTRACTION     Family History  Problem Relation Age of Onset  . Stroke Father   . Heart disease Father   . Dementia Father   . Hypertension Father   . Hyperlipidemia Father   .  Diabetes Father   . Autism Brother   . Hyperlipidemia Maternal Grandmother   . Hypertension Maternal Grandmother   . Stroke Maternal Grandfather   . Parkinson's disease Maternal Grandfather   . Pancreatic cancer Maternal Grandfather   . Stroke Paternal Grandmother   . Heart attack Paternal Grandmother    Allergies as of 02/18/2020   No Known Allergies     Medication List       Accurate as of February 18, 2020 12:24 PM. If you have any questions, ask your nurse or doctor.        STOP taking these medications   atorvastatin 40 MG tablet Commonly known as: LIPITOR Stopped by: Howard Pouch, DO   spironolactone 25 MG tablet Commonly known as: ALDACTONE Stopped by: Howard Pouch, DO     TAKE these medications   Collagen Hydrolysate (Bovine) Powd by Does not apply  route.   escitalopram 10 MG tablet Commonly known as: Lexapro Take 1 tablet (10 mg total) by mouth daily. Started by: Howard Pouch, DO   Magnesium 200 MG Tabs Take 1 tablet by mouth.   progesterone 200 MG capsule Commonly known as: PROMETRIUM Take 225 mg by mouth daily.   Testosterone 100 MG Pllt 125 mg by Implant route.   Vitamin D 125 MCG (5000 UT) Caps Take 1 tablet by mouth.   WOMENS MULTI VITAMIN & MINERAL PO Take by mouth.   HAIR VITAMINS PO Take 1 tablet by mouth.       All past medical history, surgical history, allergies, family history, immunizations andmedications were updated in the EMR today and reviewed under the history and medication portions of their EMR.     ROS: Negative, with the exception of above mentioned in HPI   Objective:  BP 138/82 (BP Location: Left Arm, Patient Position: Sitting, Cuff Size: Normal)   Pulse 70   Temp 98.3 F (36.8 C) (Temporal)   Resp 16   Ht 5\' 9"  (1.753 m)   Wt 210 lb 8 oz (95.5 kg)   SpO2 100%   BMI 31.09 kg/m  Body mass index is 31.09 kg/m. Gen: Afebrile. No acute distress. Nontoxic in appearance, well developed, well nourished.  HENT: AT. [place].  Eyes:Pupils Equal Round Reactive to light, Extraocular movements intact,  Conjunctiva without redness, discharge or icterus. Neuro:  Normal gait. PERLA. EOMi. Alert. Oriented x3 Psych: Tearful. Otherwise, Normal affect, dress and demeanor. Normal speech. Normal thought content and judgment.  No exam data present No results found. No results found for this or any previous visit (from the past 24 hour(s)).  Assessment/Plan: Tammy Estes is a 44 y.o. female present for OV for  Statin declined/Mixed hyperlipidemia Patient was prescribed statin for severely increased cholesterol panel.  She did not start medication.  Discussed with her today she is at extremely high risk for heart attack or stroke with a total cholesterol of 281 and an LDL of 201. Strongly encouraged  her to start the medication, as well as exercise and dietary modifications.  If she does elect to start the medication prescribed at her last visit-she understands to follow-up in 3 months for cholesterol recheck with LFTs. Patient was also educated on hormone replacement therapy can increase cholesterol levels.  Depression with anxiety Multiple different options was provided to patient today and she is agreeable to start both medications and therapy. - Ambulatory referral to Psychology> referral to Dr. Gaynell Face has been placed. -Start Lexapro 10 mg daily.  Follow-up in 4 weeks-if tolerating medication  and higher doses needed will taper off at that time.  Patient reports understanding.   Reviewed expectations re: course of current medical issues.  Discussed self-management of symptoms.  Outlined signs and symptoms indicating need for more acute intervention.  Patient verbalized understanding and all questions were answered.  Patient received an After-Visit Summary.    Orders Placed This Encounter  Procedures  . Ambulatory referral to Psychology   Meds ordered this encounter  Medications  . escitalopram (LEXAPRO) 10 MG tablet    Sig: Take 1 tablet (10 mg total) by mouth daily.    Dispense:  90 tablet    Refill:  0    Referral Orders     Ambulatory referral to Psychology   Note is dictated utilizing voice recognition software. Although note has been proof read prior to signing, occasional typographical errors still can be missed. If any questions arise, please do not hesitate to call for verification.   electronically signed by:  Felix Pacini, DO  [facility] [place]

## 2020-02-21 DIAGNOSIS — M542 Cervicalgia: Secondary | ICD-10-CM | POA: Diagnosis not present

## 2020-02-24 ENCOUNTER — Telehealth: Payer: Self-pay

## 2020-02-24 NOTE — Telephone Encounter (Signed)
Patient thinks she is having med reaction to prescribed meds on 3/16 by Dr. Claiborne Billings.  She doesn't know which of the meds she having the reaction to.  Severe headache, nausea and vomiting  eescitalopram (LEXAPRO) 10 MG tablet   atorvastatin 40 MG tablet (in notes pt was to stop this med)  Patient can be reached at [phone]

## 2020-02-24 NOTE — Telephone Encounter (Signed)
LMOM for pt to CB to discuss.  

## 2020-02-25 DIAGNOSIS — M5412 Radiculopathy, cervical region: Secondary | ICD-10-CM | POA: Diagnosis not present

## 2020-02-25 NOTE — Telephone Encounter (Signed)
Pt took Atorvastatin and Lexapro for the first time yesterday morning. Had N/V/D about 45 mins after taking Atorvastatin and Lexapro. Lasted most of the morning and then she slept all day. Denies fever. Nobody else sick in the home and has not been exposed to COVID.  She felt dizziness, shaky, and lightheaded as well. She only took the Atorvastatin this morning and states she does not feel sick yet. She will continue to take the Atorvastatin each morning unless sees side effects.   Sent to Dr Claiborne Billings to review

## 2020-02-25 NOTE — Telephone Encounter (Signed)
Patient called back yesterday right at 5pm. I told her I would have someone call her back today.   Thank you.

## 2020-02-26 NOTE — Telephone Encounter (Signed)
Pt was called and given all information

## 2020-02-26 NOTE — Telephone Encounter (Signed)
Sorry to hear she was ill.  This could been a acute illness versus a reaction to one of the medications.  If taking atorvastatin without incident I would recommend she continue. She has not tried to take the Lexapro again now that she is feeling improved.  I would encourage her to attempt to take Lexapro again just not at the same time as the atorvastatin.  Headache, nausea and vomiting is not a common constellation of symptoms to occur together with the use of either of these medications.  It truly sounds as if it was an acute illness.

## 2020-02-28 DIAGNOSIS — M5412 Radiculopathy, cervical region: Secondary | ICD-10-CM | POA: Diagnosis not present

## 2020-03-06 DIAGNOSIS — N951 Menopausal and female climacteric states: Secondary | ICD-10-CM | POA: Diagnosis not present

## 2020-03-06 DIAGNOSIS — Z72820 Sleep deprivation: Secondary | ICD-10-CM | POA: Diagnosis not present

## 2020-03-06 DIAGNOSIS — R5383 Other fatigue: Secondary | ICD-10-CM | POA: Diagnosis not present

## 2020-03-10 DIAGNOSIS — N951 Menopausal and female climacteric states: Secondary | ICD-10-CM | POA: Diagnosis not present

## 2020-03-10 DIAGNOSIS — R5383 Other fatigue: Secondary | ICD-10-CM | POA: Diagnosis not present

## 2020-03-10 DIAGNOSIS — R6882 Decreased libido: Secondary | ICD-10-CM | POA: Diagnosis not present

## 2020-03-10 DIAGNOSIS — Z72820 Sleep deprivation: Secondary | ICD-10-CM | POA: Diagnosis not present

## 2020-03-11 DIAGNOSIS — M79601 Pain in right arm: Secondary | ICD-10-CM | POA: Diagnosis not present

## 2020-03-16 DIAGNOSIS — M5412 Radiculopathy, cervical region: Secondary | ICD-10-CM | POA: Diagnosis not present

## 2020-03-25 DIAGNOSIS — Z713 Dietary counseling and surveillance: Secondary | ICD-10-CM | POA: Diagnosis not present

## 2020-03-26 DIAGNOSIS — M5412 Radiculopathy, cervical region: Secondary | ICD-10-CM | POA: Diagnosis not present

## 2020-03-30 ENCOUNTER — Telehealth: Payer: Self-pay

## 2020-03-30 NOTE — Telephone Encounter (Signed)
Pt called and left a voicemail stating she is having Cervical spine decompression surgery on Tuesday, 03/31/2020. She is concerned with white coat syndrome and not being able to have the surgery due to how high her BP might be the morning of surgery.  The surgical center advised her to see if she can get a xanax or something to help calm her down the morning of or night before the surgery to help. Pt was called and she stated that  She spoke with surgeons office after she left the message and they stated if she needed any medications to help lower her BP then she would be given something at the surgical center and have to be approved by anaesthesiologist.

## 2020-03-31 DIAGNOSIS — M4802 Spinal stenosis, cervical region: Secondary | ICD-10-CM | POA: Diagnosis not present

## 2020-03-31 DIAGNOSIS — M5412 Radiculopathy, cervical region: Secondary | ICD-10-CM | POA: Diagnosis not present

## 2020-03-31 HISTORY — PX: OTHER SURGICAL HISTORY: SHX169

## 2020-04-06 ENCOUNTER — Emergency Department (HOSPITAL_BASED_OUTPATIENT_CLINIC_OR_DEPARTMENT_OTHER): Payer: BC Managed Care – PPO

## 2020-04-06 ENCOUNTER — Encounter (HOSPITAL_BASED_OUTPATIENT_CLINIC_OR_DEPARTMENT_OTHER): Payer: Self-pay | Admitting: *Deleted

## 2020-04-06 ENCOUNTER — Other Ambulatory Visit: Payer: Self-pay

## 2020-04-06 ENCOUNTER — Emergency Department (HOSPITAL_BASED_OUTPATIENT_CLINIC_OR_DEPARTMENT_OTHER)
Admission: EM | Admit: 2020-04-06 | Discharge: 2020-04-06 | Disposition: A | Discharge status: Home / Self Care | Payer: BC Managed Care – PPO | Attending: Emergency Medicine | Admitting: Emergency Medicine

## 2020-04-06 DIAGNOSIS — R05 Cough: Secondary | ICD-10-CM | POA: Diagnosis not present

## 2020-04-06 DIAGNOSIS — I10 Essential (primary) hypertension: Secondary | ICD-10-CM | POA: Diagnosis not present

## 2020-04-06 DIAGNOSIS — R42 Dizziness and giddiness: Secondary | ICD-10-CM | POA: Insufficient documentation

## 2020-04-06 DIAGNOSIS — R002 Palpitations: Secondary | ICD-10-CM | POA: Diagnosis not present

## 2020-04-06 DIAGNOSIS — R519 Headache, unspecified: Secondary | ICD-10-CM | POA: Diagnosis not present

## 2020-04-06 DIAGNOSIS — R0981 Nasal congestion: Secondary | ICD-10-CM

## 2020-04-06 LAB — COMPREHENSIVE METABOLIC PANEL
ALT: 22 U/L (ref 0–44)
AST: 22 U/L (ref 15–41)
Albumin: 4.7 g/dL (ref 3.5–5.0)
Alkaline Phosphatase: 56 U/L (ref 38–126)
Anion gap: 12 (ref 5–15)
BUN: 8 mg/dL (ref 6–20)
CO2: 23 mmol/L (ref 22–32)
Calcium: 9.4 mg/dL (ref 8.9–10.3)
Chloride: 104 mmol/L (ref 98–111)
Creatinine, Ser: 0.77 mg/dL (ref 0.44–1.00)
GFR calc Af Amer: >60 mL/min (ref >60)
GFR calc non Af Amer: >60 mL/min (ref >60)
Glucose, Bld: 103 mg/dL — ABNORMAL HIGH (ref 70–99)
Potassium: 3.8 mmol/L (ref 3.5–5.1)
Sodium: 139 mmol/L (ref 135–145)
Total Bilirubin: 0.7 mg/dL (ref 0.3–1.2)
Total Protein: 8.2 g/dL — ABNORMAL HIGH (ref 6.5–8.1)

## 2020-04-06 LAB — CBC WITH DIFFERENTIAL/PLATELET
Abs Immature Granulocytes: 0.02 10*3/uL (ref 0.00–0.07)
Basophils Absolute: 0 10*3/uL (ref 0.0–0.1)
Basophils Relative: 0 %
Eosinophils Absolute: 0 10*3/uL (ref 0.0–0.5)
Eosinophils Relative: 0 %
HCT: 46.1 % — ABNORMAL HIGH (ref 36.0–46.0)
Hemoglobin: 15.5 g/dL — ABNORMAL HIGH (ref 12.0–15.0)
Immature Granulocytes: 0 %
Lymphocytes Relative: 25 %
Lymphs Abs: 2.3 10*3/uL (ref 0.7–4.0)
MCH: 30.7 pg (ref 26.0–34.0)
MCHC: 33.6 g/dL (ref 30.0–36.0)
MCV: 91.3 fL (ref 80.0–100.0)
Monocytes Absolute: 0.8 10*3/uL (ref 0.1–1.0)
Monocytes Relative: 8 %
Neutro Abs: 5.9 10*3/uL (ref 1.7–7.7)
Neutrophils Relative %: 67 %
Platelets: 337 10*3/uL (ref 150–400)
RBC: 5.05 MIL/uL (ref 3.87–5.11)
RDW: 12.3 % (ref 11.5–15.5)
WBC: 8.9 10*3/uL (ref 4.0–10.5)
nRBC: 0 % (ref 0.0–0.2)

## 2020-04-06 MED ORDER — SODIUM CHLORIDE 0.9 % IV BOLUS
250.0000 mL | Freq: Once | INTRAVENOUS | Status: AC
  Administered 2020-04-06: 16:00:00 250 mL via INTRAVENOUS

## 2020-04-06 MED ORDER — MORPHINE SULFATE (PF) 4 MG/ML IV SOLN
4.0000 mg | Freq: Once | INTRAVENOUS | Status: AC
  Administered 2020-04-06: 16:00:00 4 mg via INTRAVENOUS
  Filled 2020-04-06: qty 1

## 2020-04-06 MED ORDER — ONDANSETRON HCL 4 MG/2ML IJ SOLN
4.0000 mg | Freq: Once | INTRAMUSCULAR | Status: AC
  Administered 2020-04-06: 4 mg via INTRAVENOUS
  Filled 2020-04-06: qty 2

## 2020-04-06 MED ORDER — FLUTICASONE PROPIONATE 50 MCG/ACT NA SUSP: 2.0000 | Freq: Every day | NASAL | 0 refills | Status: DC

## 2020-04-06 MED ORDER — DIAZEPAM 5 MG/ML IJ SOLN
5.0000 mg | Freq: Once | INTRAMUSCULAR | Status: AC
  Administered 2020-04-06: 17:00:00 5 mg via INTRAVENOUS
  Filled 2020-04-06: qty 2

## 2020-04-06 NOTE — ED Provider Notes (Signed)
Emergency Department Provider Note   I have reviewed the triage vital signs and the nursing notes.   HISTORY  Chief Complaint Hypertension and Palpitations   HPI Tammy Estes is a 44 y.o. 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  She states that she has had a relatively uneventful postoperative course.  At baseline she has "white coat" HTN but is not on meds.  She states that 2 days ago she was able to stop taking the hydrocodone and feeling okay.  Yesterday she was able to transition to a softer collar and wash her hair.  She thinks she may have done too much because started getting some neck pain and headache.  The symptoms were typical of her early postoperative course and not worse than that.  She took additional hydrocodone last night and this morning.  She developed some lightheadedness and headache along with mild nausea.  She denies any numbness or weakness in the extremities.  She is not having severe neck pain, difficulty swallowing, change in voice.  The patient took her blood pressure at home and was getting very high readings on multiple checks and so presents to the ED for evaluation. No radiation of symptoms or modifying factors.  Patient has been taking Sudafed and Mucinex for congestion type symptoms but last took this medication yesterday.    Past Medical History:  Diagnosis Date  . Allergy   . Arthritis   . Chicken pox   . Elevated bilirubin 10/2019   with fasting labs only>> rpt normal.   . History of frequent urinary tract infections   . Hypertension   . Vitamin D deficiency     Patient Active Problem List   Diagnosis Date Noted  . Statin declined 02/18/2020  . Hyperlipidemia 02/18/2020  . Vitamin D deficiency 10/02/2019    Past Surgical History:  Procedure Laterality Date  . ABDOMINAL HYSTERECTOMY  2015   partial for  adenomyosis and endometriosis  . TONSILLECTOMY AND ADENOIDECTOMY  1996  . WISDOM TOOTH EXTRACTION      Allergies Patient has no known allergies.  Family History  Problem Relation Age of Onset  . Stroke Father   . Heart disease Father   . Dementia Father   . Hypertension Father   . Hyperlipidemia Father   . Diabetes Father   . Autism Brother   . Hyperlipidemia Maternal Grandmother   . Hypertension Maternal Grandmother   . Stroke Maternal Grandfather   . Parkinson's disease Maternal Grandfather   . Pancreatic cancer Maternal Grandfather   . Stroke Paternal Grandmother   . Heart attack Paternal Grandmother     Social History Social History   Tobacco Use  . Smoking status: Never Smoker  . Smokeless tobacco: Never Used  Substance Use Topics  . Alcohol use: Yes    Alcohol/week: 1.0 standard drinks    Types: 1 Glasses of wine per week    Comment: Socailly   . Drug use: Never    Review of Systems  Constitutional: No fever/chills Eyes: No visual changes. ENT: No sore throat. Positive congestion.  Cardiovascular: Denies chest pain. Elevated BP.  Respiratory: Denies shortness of breath. Gastrointestinal: No abdominal pain. Positive nausea, no vomiting.  No diarrhea.  No constipation. Genitourinary: Negative for dysuria. Musculoskeletal: Negative for back pain. Positive neck pain (post op).  Skin: Negative for rash. Neurological: Negative for focal weakness or numbness. Positive  HA.   10-point ROS otherwise negative.  ____________________________________________   PHYSICAL EXAM:  VITAL SIGNS: ED Triage Vitals  Enc Vitals Group     BP 04/06/20 1529 (!) 192/130     Pulse Rate 04/06/20 1529 (!) 107     Resp 04/06/20 1529 20     Temp 04/06/20 1529 98.4 F (36.9 C)     Temp Source 04/06/20 1529 Oral     SpO2 04/06/20 1529 100 %     Weight 04/06/20 1524 210 lb 8.6 oz (95.5 kg)     Height 04/06/20 1524 5\' 9"  (1.753 m)   Constitutional: Alert and oriented. Well  appearing and in no acute distress. Speaking in a clear voice and managing oral secretions.  Eyes: Conjunctivae are normal. PERRL Head: Atraumatic. Nose: No congestion/rhinnorhea. Mouth/Throat: Mucous membranes are moist.  Oropharynx non-erythematous. Neck: No stridor. Aspen collar in place. Anterior neck dressing visible through the collar and is well-appearing.  Cardiovascular: Tachycardia. Good peripheral circulation. Grossly normal heart sounds.   Respiratory: Normal respiratory effort.  No retractions. Lungs CTAB. Gastrointestinal: Soft and nontender. No distention.  Musculoskeletal: No lower extremity tenderness nor edema. No gross deformities of extremities. Neurologic:  Normal speech and language. No gross focal neurologic deficits are appreciated.  Normal strength and sensation in the bilateral upper and lower extremities. Skin:  Skin is warm, dry and intact. No rash noted.   ____________________________________________   LABS (all labs ordered are listed, but only abnormal results are displayed)  Labs Reviewed  CBC WITH DIFFERENTIAL/PLATELET - Abnormal; Notable for the following components:      Result Value   Hemoglobin 15.5 (*)    HCT 46.1 (*)    All other components within normal limits  COMPREHENSIVE METABOLIC PANEL - Abnormal; Notable for the following components:   Glucose, Bld 103 (*)    Total Protein 8.2 (*)    All other components within normal limits   ____________________________________________  EKG   EKG Interpretation  Date/Time:  Monday Apr 06 2020 15:20:52 EDT Ventricular Rate:  118 PR Interval:  128 QRS Duration: 76 QT Interval:  328 QTC Calculation: 459 R Axis:   76 Text Interpretation: Sinus tachycardia Otherwise normal ECG No STEMI Confirmed by 01-29-1971 [phone]) on 04/06/2020 3:53:57 PM       ____________________________________________  RADIOLOGY  CT Head Wo Contrast  Result Date: 04/06/2020 CLINICAL DATA:  Headache. EXAM: CT HEAD  WITHOUT CONTRAST TECHNIQUE: Contiguous axial images were obtained from the base of the skull through the vertex without intravenous contrast. COMPARISON:  None. FINDINGS: Brain: No evidence of acute infarction, hemorrhage, hydrocephalus, extra-axial collection or mass lesion/mass effect. Vascular: No hyperdense vessel or unexpected calcification. Skull: Normal. Negative for fracture or focal lesion. 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 Other: None. 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 No acute intracranial abnormality seen. Electronically Signed   By: 06/06/2020 M.D.   On: 04/06/2020 16:20   DG Chest Portable 1 View  Result Date: 04/06/2020 CLINICAL DATA:  Postop cough. EXAM: PORTABLE CHEST 1 VIEW COMPARISON:  None. FINDINGS: The heart size and mediastinal contours are within normal limits. Both lungs are clear. The visualized skeletal structures are unremarkable. IMPRESSION: No active disease. Electronically Signed   By: 06/06/2020 M.D.   On: 04/06/2020 16:14    ____________________________________________   PROCEDURES  Procedure(s) performed:   Procedures  None  ____________________________________________   INITIAL IMPRESSION / ASSESSMENT AND PLAN / ED COURSE  Pertinent labs & imaging results that were available during my care of the patient were reviewed by me and considered in my medical decision making (see chart for details).   Patient presents emergency department for evaluation of elevated blood pressure in the setting of recent surgery with headache, nausea.  Her neck pain is not worse than her pain has been since the operation.  She has no neurologic deficits.  She is not experiencing trouble swallowing or speaking.  The incision is well-appearing.  I do not have plans to image the surgical site.  Plan for CT imaging of the head given elevated blood pressure along with chest  x-ray, lab work.  Patient has been taking Sudafed and Mucinex in the past several days with congestion type symptoms.  This may be contributing to her elevated blood pressure along with her painful postoperative course.  Plan for morphine and very gentle IV fluids here.  No clear findings to suspect hypertensive emergency.  Patient is tachycardic which I attribute to pain.  She is not febrile and is not experiencing chest pain or shortness of breath to suspect postoperative PE.   05:59 PM  Lab work along with CT imaging and chest x-ray reviewed.  Patient feeling somewhat improved after Valium here.  Blood pressure downtrending slightly but likely multifactorial cause for its elevation.  No hypertensive emergency or other findings to suspect complication postop.  Advised that the patient not use Sudafed or other over-the-counter decongestions as this can cause blood pressure to rise further.  She will call her PCP for follow-up along with her neurosurgery team.  Patient has pain and muscle relaxer medications at home.  Discussed ED return precautions. Husband at bedside to drive the patient home.  ____________________________________________  FINAL CLINICAL IMPRESSION(S) / ED DIAGNOSES  Final diagnoses:  Hypertension, unspecified type  Acute nonintractable headache, unspecified headache type  Complaint of nasal congestion  Dizzy spells     MEDICATIONS GIVEN DURING THIS VISIT:  Medications  sodium chloride 0.9 % bolus 250 mL (0 mLs Intravenous Stopped 04/06/20 1656)  ondansetron (ZOFRAN) injection 4 mg (4 mg Intravenous Given 04/06/20 1617)  morphine 4 MG/ML injection 4 mg (4 mg Intravenous Given 04/06/20 1619)  diazepam (VALIUM) injection 5 mg (5 mg Intravenous Given 04/06/20 1721)     NEW OUTPATIENT MEDICATIONS STARTED DURING THIS VISIT:  New Prescriptions   FLUTICASONE (FLONASE) 50 MCG/ACT NASAL SPRAY    Place 2 sprays into both nostrils daily for 7 days.    Note:  This document was prepared  using Dragon voice recognition software and may include unintentional dictation errors.  Nanda Quinton, MD, [facility]    Kaisey Huseby, [facility], MD 04/06/20 1800

## 2020-04-06 NOTE — Discharge Instructions (Signed)
You were seen in the emergency department today with elevated blood pressure along with headache and congestion in the nose.  I am starting you on Flonase.  With your blood pressure being elevated please avoid any Sudafed type medications as this can cause your blood pressure to rise further.  Please take your pain and muscle relaxer medication as prescribed.  If you continue to have elevated blood pressure after your immediate postoperative pain resolves your primary care doctor may consider starting you on blood pressure medication.  I will not start you on medicine today.  Please call your primary care doctor first thing tomorrow morning to schedule a follow-up appointment along with your neurosurgery team to make them aware of your ED visit.   Return to the emergency department any new or suddenly worsening symptoms.

## 2020-04-06 NOTE — ED Notes (Signed)
Pt. Noted with C Collar on and reports she has phlegm in her throat and she with a headache starting this morning when she woke.  Pt. Had an ACDF surgical procedure done. Pt. B/P is elevated. Pt. Has pain meds at home and reports she gets white coat syndrome but today at home the pressure was elevated and the HR was up as well.  The surgery was Tues a week ago per the Pt.

## 2020-04-06 NOTE — ED Triage Notes (Addendum)
Palpitations and HTN at home. She had surgery 6 days ago. No hx of HTN after surgery.

## 2020-04-08 ENCOUNTER — Other Ambulatory Visit: Payer: Self-pay

## 2020-04-08 ENCOUNTER — Ambulatory Visit: Payer: BC Managed Care – PPO | Admitting: Family Medicine

## 2020-04-08 ENCOUNTER — Encounter: Payer: Self-pay | Admitting: Family Medicine

## 2020-04-08 VITALS — BP 168/121 | HR 79 | Temp 98.5°F | Resp 18 | Ht 69.0 in | Wt 195.1 lb

## 2020-04-08 DIAGNOSIS — G4452 New daily persistent headache (NDPH): Secondary | ICD-10-CM | POA: Diagnosis not present

## 2020-04-08 DIAGNOSIS — R112 Nausea with vomiting, unspecified: Secondary | ICD-10-CM | POA: Diagnosis not present

## 2020-04-08 DIAGNOSIS — R030 Elevated blood-pressure reading, without diagnosis of hypertension: Secondary | ICD-10-CM | POA: Diagnosis not present

## 2020-04-08 MED ORDER — CLONIDINE HCL 0.1 MG PO TABS
ORAL_TABLET | ORAL | 0 refills | Status: DC
Start: 2020-04-08 — End: 2021-02-15

## 2020-04-08 MED ORDER — ONDANSETRON HCL 4 MG/2ML IJ SOLN
4.0000 mg | Freq: Once | INTRAMUSCULAR | Status: AC
  Administered 2020-04-08: 4 mg via INTRAMUSCULAR

## 2020-04-08 MED ORDER — ONDANSETRON HCL 4 MG PO TABS
4.0000 mg | ORAL_TABLET | Freq: Three times a day (TID) | ORAL | 0 refills | Status: DC | PRN
Start: 2020-04-08 — End: 2020-06-01

## 2020-04-08 MED ORDER — DIPHENHYDRAMINE HCL 50 MG/ML IJ SOLN
25.0000 mg | Freq: Once | INTRAMUSCULAR | Status: AC
  Administered 2020-04-08: 25 mg via INTRAMUSCULAR

## 2020-04-08 NOTE — Patient Instructions (Signed)
Since you CT was normal- your headache sounds like a Migraine.   Zofran prescribed for nausea- you can take every 8 hours. This was provided in your shot today.  Benadryl was also in your shot today. This may make you very drowsy.    Prescriptions: Zofran, vistaril (like benadryl but also helps with nausea) will help skin rash too.   clonidine 0.1 mg is for BP greater than 180 on top or 110 bottom.  Can be taken every 8 hours if needed.    Call your surgeon today, they need to know what is happening and may want to see you sooner.    Avoid over doing it until cleared by your surgeon.

## 2020-04-08 NOTE — Progress Notes (Signed)
This visit occurred during the SARS-CoV-2 public health emergency.  Safety protocols were in place, including screening questions prior to the visit, additional usage of staff PPE, and extensive cleaning of exam room while observing appropriate contact time as indicated for disinfecting solutions.    Tammy Estes , 08/16/1976, 44 y.o., female MRN: [id number] Patient Care Team    Relationship Specialty Notifications Start End  Ma Hillock, DO PCP - General Family Medicine  10/02/19   Rockey Situ  Obstetrics and Gynecology  10/02/19     Chief Complaint  Patient presents with  . Hypertension    Pt went to ED for High blood pressure. . Tachycardia with bad headache. Vomited all night last night and has been unable to take pain medications       Subjective: Pt presents for an OV with complaints of headache and nausea.  She was seen in the emergency room 2 days ago for elevated blood pressures, headache and nausea.  Labs normal CMP, mildly elevated hemoglobin/CBC.  CT head without acute intracranial abnormality.  Opacification of sphenoid sinus was present.  Patient reports her headache 2 days ago was on both sides of her head, today it is mainly focused for the left anterior left side of her head.  She endorses vertigo and spinning.  She endorses nausea.  She endorses vomiting that started last night.  She states the hydrocodone had been working for her pain however with her nausea she has been unable to tolerate it.  She states she was doing fine from a postsurgical standpoint and stopped taking pain meds on Friday.  She then increased her activity and washed her hair.  She recently underwent a ACFD fusion of her cervical spine with cadaver graft 03/31/2020.  She has been told not to take steroids or NSAIDs secondary to cadaver graft.  She has not contacted her neurosurgical team. Patient is known to have whitecoat syndrome.  Her blood pressures have been significantly elevated  over the last few years intermittently.  She had been placed on blood pressure medications at 1 time and had hypotensive episode. She has not had a history of migraines in the past.  She has not been able to sleep over the last 2-3 days or hydrate.   Depression screen [facility] 2/9 02/18/2020 10/02/2019  Decreased Interest 3 0  Down, Depressed, Hopeless 3 0  PHQ - 2 Score 6 0  Altered sleeping 3 -  Tired, decreased energy 3 -  Change in appetite 3 -  Feeling bad or failure about yourself  3 -  Trouble concentrating 3 -  Moving slowly or fidgety/restless 3 -  Suicidal thoughts 0 -  PHQ-9 Score 24 -  Difficult doing work/chores Very difficult -    No Known Allergies Social History   Social History Narrative   Marital status/children/pets: married, 2 children.    Education/employment: employeed, realtor   Safety:      -smoke alarm in the home:Yes     - wears seatbelt: Yes     - Feels safe in their relationships: Yes   Past Medical History:  Diagnosis Date  . Allergy   . Arthritis   . Chicken pox   . Elevated bilirubin 10/2019   with fasting labs only>> rpt normal.   . History of frequent urinary tract infections   . Hypertension   . Vitamin D deficiency    Past Surgical History:  Procedure Laterality Date  . ABDOMINAL HYSTERECTOMY  2015  partial for adenomyosis and endometriosis  . TONSILLECTOMY AND ADENOIDECTOMY  1996  . WISDOM TOOTH EXTRACTION     Family History  Problem Relation Age of Onset  . Stroke Father   . Heart disease Father   . Dementia Father   . Hypertension Father   . Hyperlipidemia Father   . Diabetes Father   . Autism Brother   . Hyperlipidemia Maternal Grandmother   . Hypertension Maternal Grandmother   . Stroke Maternal Grandfather   . Parkinson's disease Maternal Grandfather   . Pancreatic cancer Maternal Grandfather   . Stroke Paternal Grandmother   . Heart attack Paternal Grandmother    Allergies as of 04/08/2020   No Known Allergies       Medication List       Accurate as of Apr 08, 2020  5:29 PM. If you have any questions, ask your nurse or doctor.        STOP taking these medications   escitalopram 10 MG tablet Commonly known as: Lexapro Stopped by: Felix Pacini, DO     TAKE these medications   acetaminophen 325 MG tablet Commonly known as: TYLENOL Take 650 mg by mouth every 6 (six) hours as needed.   cloNIDine 0.1 MG tablet Commonly known as: CATAPRES 0.1 mg TID PRN for elevated BP Started by: Felix Pacini, DO   Collagen Hydrolysate (Bovine) Powd by Does not apply route.   fluticasone 50 MCG/ACT nasal spray Commonly known as: FLONASE Place 2 sprays into both nostrils daily for 7 days.   HAIR VITAMINS PO Take 1 tablet by mouth. What changed: Another medication with the same name was removed. Continue taking this medication, and follow the directions you see here. Changed by: Felix Pacini, DO   Magnesium 200 MG Tabs Take 1 tablet by mouth.   methocarbamol 500 MG tablet Commonly known as: ROBAXIN methocarbamol 500 mg tablet   Norco 5-325 MG tablet Generic drug: HYDROcodone-acetaminophen Take 1 tablet by mouth every 6 (six) hours as needed.   ondansetron 4 MG tablet Commonly known as: ZOFRAN Take 1 tablet (4 mg total) by mouth every 8 (eight) hours as needed for nausea or vomiting. Started by: Felix Pacini, DO   progesterone 200 MG capsule Commonly known as: PROMETRIUM Take 225 mg by mouth daily.   Testosterone 100 MG Pllt 125 mg by Implant route.   Vitamin D 125 MCG (5000 UT) Caps Take 1 tablet by mouth.       All past medical history, surgical history, allergies, family history, immunizations andmedications were updated in the EMR today and reviewed under the history and medication portions of their EMR.     ROS: Negative, with the exception of above mentioned in HPI   Objective:  BP (!) 168/121 (BP Location: Left Arm, Patient Position: Sitting, Cuff Size: Normal)   Pulse 79    Temp 98.5 F (36.9 C) (Temporal)   Resp 18   Ht 5\' 9"  (1.753 m)   Wt 195 lb 2 oz (88.5 kg)   SpO2 98%   BMI 28.81 kg/m  Body mass index is 28.81 kg/m. Gen: Afebrile. No acute distress. Nontoxic in appearance, well developed, well nourished.  Wearing cervical collar. HENT: AT. [place].  Eyes:Pupils Equal Round Reactive to light, Extraocular movements intact,  Conjunctiva without redness, discharge or icterus. CV: RRR no murmur, no edema Chest: CTAB, no wheeze or crackles. Good air movement, normal resp effort.  Skin: Mild maculopapular rash underneath the cervical collar along chest line.  no purpura  or petechiae.  Neuro: Normal gait. PERLA. EOMi. Alert. Oriented x3.  MS 5/5 B-U/LE  Psych: Normal affect, dress and demeanor. Normal speech. Normal thought content and judgment.  No exam data present No results found. No results found for this or any previous visit (from the past 24 hour(s)).  Assessment/Plan: Tammy Estes is a 44 y.o. female present for OV for  She is to continue to rest.  Decrease the activity.  Symptoms started when she stopped her pain medication and increased her activity. -Clonidine 0.1 mg 3 times daily as needed for blood pressures above 180/110.  She has a history of elevated blood pressures during office appointments on multiple occasions secondary to whitecoat syndrome.  When tried on blood pressure medication she had hypotension therefore will proceed with caution on medicating her blood pressure. -Vistaril as prescribed for help with anxiety, muscle relaxation and nausea.  She also has a mild rash underneath the collar.  Encouraged her to use hydrocortisone or Benadryl gel over rash as well. -Start Zofran every 8 hours as needed.  Zofran injection provided today with 12.5 mg of Benadryl in hopes to help break the headache cycle if it is migraine.  Unable to provide steroid or NSAID with medication secondary to cadaver graft.  Encouraged her to take hydrocodone  provided by her neurosurgical team when she gets home. -Patient advised if symptoms are worsening and/or she is unable to tolerate p.o., she needs to report back to the emergency room for further evaluation. -She will call her surgical team today and report her symptoms.  They will likely need to see her sooner than her routine scheduled postop. If still requiring blood pressure medication in 3-4 weeks, will need to follow-up for further evaluation here.   Reviewed expectations re: course of current medical issues.  Discussed self-management of symptoms.  Outlined signs and symptoms indicating need for more acute intervention.  Patient verbalized understanding and all questions were answered.  Patient received an After-Visit Summary.    No orders of the defined types were placed in this encounter.  Meds ordered this encounter  Medications  . ondansetron (ZOFRAN) 4 MG tablet    Sig: Take 1 tablet (4 mg total) by mouth every 8 (eight) hours as needed for nausea or vomiting.    Dispense:  30 tablet    Refill:  0  . cloNIDine (CATAPRES) 0.1 MG tablet    Sig: 0.1 mg TID PRN for elevated BP    Dispense:  60 tablet    Refill:  0  . ondansetron (ZOFRAN) injection 4 mg  . diphenhydrAMINE (BENADRYL) injection 25 mg   Referral Orders  No referral(s) requested today     Note is dictated utilizing voice recognition software. Although note has been proof read prior to signing, occasional typographical errors still can be missed. If any questions arise, please do not hesitate to call for verification.   electronically signed by:  Felix Pacini, DO  [facility] [place]

## 2020-04-15 DIAGNOSIS — Z9889 Other specified postprocedural states: Secondary | ICD-10-CM | POA: Diagnosis not present

## 2020-04-15 DIAGNOSIS — M5412 Radiculopathy, cervical region: Secondary | ICD-10-CM | POA: Diagnosis not present

## 2020-04-16 ENCOUNTER — Ambulatory Visit: Payer: BC Managed Care – PPO | Admitting: Professional

## 2020-04-16 DIAGNOSIS — Z981 Arthrodesis status: Secondary | ICD-10-CM | POA: Diagnosis not present

## 2020-05-05 ENCOUNTER — Encounter: Payer: Self-pay | Admitting: Family Medicine

## 2020-05-05 DIAGNOSIS — Z713 Dietary counseling and surveillance: Secondary | ICD-10-CM | POA: Diagnosis not present

## 2020-05-08 DIAGNOSIS — Z9889 Other specified postprocedural states: Secondary | ICD-10-CM | POA: Diagnosis not present

## 2020-05-14 DIAGNOSIS — M4322 Fusion of spine, cervical region: Secondary | ICD-10-CM | POA: Diagnosis not present

## 2020-05-14 DIAGNOSIS — M6281 Muscle weakness (generalized): Secondary | ICD-10-CM | POA: Diagnosis not present

## 2020-05-19 DIAGNOSIS — M4322 Fusion of spine, cervical region: Secondary | ICD-10-CM | POA: Diagnosis not present

## 2020-05-19 DIAGNOSIS — M6281 Muscle weakness (generalized): Secondary | ICD-10-CM | POA: Diagnosis not present

## 2020-05-21 DIAGNOSIS — M4322 Fusion of spine, cervical region: Secondary | ICD-10-CM | POA: Diagnosis not present

## 2020-05-21 DIAGNOSIS — M6281 Muscle weakness (generalized): Secondary | ICD-10-CM | POA: Diagnosis not present

## 2020-05-26 DIAGNOSIS — M6281 Muscle weakness (generalized): Secondary | ICD-10-CM | POA: Diagnosis not present

## 2020-05-26 DIAGNOSIS — M4322 Fusion of spine, cervical region: Secondary | ICD-10-CM | POA: Diagnosis not present

## 2020-05-28 DIAGNOSIS — M4322 Fusion of spine, cervical region: Secondary | ICD-10-CM | POA: Diagnosis not present

## 2020-05-28 DIAGNOSIS — M6281 Muscle weakness (generalized): Secondary | ICD-10-CM | POA: Diagnosis not present

## 2020-06-01 ENCOUNTER — Ambulatory Visit: Payer: BC Managed Care – PPO | Admitting: Family Medicine

## 2020-06-01 ENCOUNTER — Other Ambulatory Visit: Payer: Self-pay

## 2020-06-01 ENCOUNTER — Encounter: Payer: Self-pay | Admitting: Family Medicine

## 2020-06-01 VITALS — BP 151/93 | HR 78 | Temp 98.2°F | Resp 17 | Ht 69.0 in | Wt 207.0 lb

## 2020-06-01 DIAGNOSIS — H8113 Benign paroxysmal vertigo, bilateral: Secondary | ICD-10-CM

## 2020-06-01 DIAGNOSIS — I10 Essential (primary) hypertension: Secondary | ICD-10-CM

## 2020-06-01 MED ORDER — AMLODIPINE BESYLATE 2.5 MG PO TABS: 2.5000 mg | ORAL_TABLET | Freq: Every day | ORAL | 1 refills | Status: DC

## 2020-06-01 MED ORDER — MECLIZINE HCL 25 MG PO TABS: 25.0000 mg | ORAL_TABLET | Freq: Every day | ORAL | 1 refills | Status: AC

## 2020-06-01 MED ORDER — FLUTICASONE PROPIONATE 50 MCG/ACT NA SUSP: 2.0000 | Freq: Every day | NASAL | 11 refills | Status: AC

## 2020-06-01 NOTE — Progress Notes (Signed)
This visit occurred during the SARS-CoV-2 public health emergency.  Safety protocols were in place, including screening questions prior to the visit, additional usage of staff PPE, and extensive cleaning of exam room while observing appropriate contact time as indicated for disinfecting solutions.    Tammy Estes , [birth year], 44 y.o., female MRN: [id number] Patient Care Team    Relationship Specialty Notifications Start End  Natalia Leatherwood, DO PCP - General Family Medicine  10/02/19   Karleen Dolphin  Obstetrics and Gynecology  10/02/19   Estill Bamberg, MD Consulting Physician Orthopedic Surgery  05/05/20     Chief Complaint  Patient presents with  . Dizziness    Pt has been getting vertgo at night when she lays down in the bed and when she gets up in the morning. It only happens with certain head movements      Subjective: Pt presents for an OV with complaints of vertigo.  HTN/vertigo: She has only needed the clonidine  a few times since last seen. SHe is recovering well from her cervical spine surgery. She is performing PT and tolerating.  Patient denies chest pain, shortness of breath or lower extremity edema.Pt has declined  prescribed statin. She has been experiencing vertigo, mostly at night, when she lays down in bed. The movement is worse when moving her head in both left and right movements (Left is worse).    Depression screen [facility] 2/9 02/18/2020 10/02/2019  Decreased Interest 3 0  Down, Depressed, Hopeless 3 0  PHQ - 2 Score 6 0  Altered sleeping 3 -  Tired, decreased energy 3 -  Change in appetite 3 -  Feeling bad or failure about yourself  3 -  Trouble concentrating 3 -  Moving slowly or fidgety/restless 3 -  Suicidal thoughts 0 -  PHQ-9 Score 24 -  Difficult doing work/chores Very difficult -    No Known Allergies Social History   Social History Narrative   Marital status/children/pets: married, 2 children.    Education/employment: employeed,  realtor   Safety:      -smoke alarm in the home:Yes     - wears seatbelt: Yes     - Feels safe in their relationships: Yes   Past Medical History:  Diagnosis Date  . Allergy   . Arthritis   . Chicken pox   . Elevated bilirubin 10/2019   with fasting labs only>> rpt normal.   . History of frequent urinary tract infections   . Hypertension   . Vitamin D deficiency    Past Surgical History:  Procedure Laterality Date  . ABDOMINAL HYSTERECTOMY  2015   partial for adenomyosis and endometriosis  . C5-C7 ACDF  03/31/2020   Dr. Estill Bamberg  . TONSILLECTOMY AND ADENOIDECTOMY  1996  . WISDOM TOOTH EXTRACTION     Family History  Problem Relation Age of Onset  . Stroke Father   . Heart disease Father   . Dementia Father   . Hypertension Father   . Hyperlipidemia Father   . Diabetes Father   . Autism Brother   . Hyperlipidemia Maternal Grandmother   . Hypertension Maternal Grandmother   . Stroke Maternal Grandfather   . Parkinson's disease Maternal Grandfather   . Pancreatic cancer Maternal Grandfather   . Stroke Paternal Grandmother   . Heart attack Paternal Grandmother    Allergies as of 06/01/2020   No Known Allergies     Medication List       Accurate as  of June 01, 2020 11:59 PM. If you have any questions, ask your nurse or doctor.        STOP taking these medications   Norco 5-325 MG tablet Generic drug: HYDROcodone-acetaminophen Stopped by: Felix Pacini, DO   ondansetron 4 MG tablet Commonly known as: ZOFRAN Stopped by: Felix Pacini, DO     TAKE these medications   acetaminophen 325 MG tablet Commonly known as: TYLENOL Take 650 mg by mouth every 6 (six) hours as needed.   amLODipine 2.5 MG tablet Commonly known as: NORVASC Take 1 tablet (2.5 mg total) by mouth daily. Started by: Felix Pacini, DO   cloNIDine 0.1 MG tablet Commonly known as: CATAPRES 0.1 mg TID PRN for elevated BP   Collagen Hydrolysate (Bovine) Powd by Does not apply route.    fluticasone 50 MCG/ACT nasal spray Commonly known as: FLONASE Place 2 sprays into both nostrils daily.   HAIR VITAMINS PO Take 1 tablet by mouth.   Magnesium 200 MG Tabs Take 1 tablet by mouth.   meclizine 25 MG tablet Commonly known as: ANTIVERT Take 1 tablet (25 mg total) by mouth at bedtime. Started by: Felix Pacini, DO   methocarbamol 500 MG tablet Commonly known as: ROBAXIN methocarbamol 500 mg tablet   progesterone 200 MG capsule Commonly known as: PROMETRIUM Take 225 mg by mouth daily.   Testosterone 100 MG Pllt 125 mg by Implant route.   Vitamin D 125 MCG (5000 UT) Caps Take 1 tablet by mouth.       All past medical history, surgical history, allergies, family history, immunizations andmedications were updated in the EMR today and reviewed under the history and medication portions of their EMR.     ROS: Negative, with the exception of above mentioned in HPI   Objective:  BP (!) 151/93 (BP Location: Left Arm, Patient Position: Sitting, Cuff Size: Normal)   Pulse 78   Temp 98.2 F (36.8 C) (Temporal)   Resp 17   Ht 5\' 9"  (1.753 m)   Wt 207 lb (93.9 kg)   SpO2 97%   BMI 30.57 kg/m  Body mass index is 30.57 kg/m. Gen: Afebrile. No acute distress. pleasant female.  HENT: AT. [place]. Bilateral TM visualized and normal in appearance- mild effusion bilateral, no erythema. MMM. Bilateral nares without erythema or drainage. Throat without erythema or exudates.  Eyes:Pupils Equal Round Reactive to light, Extraocular movements intact,  Conjunctiva without redness, discharge or icterus. CV: RRR, no murmur Chest: CTAB, no wheeze or crackles Skin: no rashes, purpura or petechiae.  Neuro: Normal gait. PERLA. EOMi. Alert. Oriented x3  Psych: Normal affect, dress and demeanor. Normal speech. Normal thought content and judgment..    No exam data present No results found. No results found for this or any previous visit (from the past 24  hour(s)).  Assessment/Plan: Tammy Estes is a 44 y.o. female present for OV for  Essential hypertension Above goal today. Hone pressures are also reported as mildly above goal.  Start amlodipine 2.5 mg QD continue to monitor BP: Goal > 135/85 F/u 5.5 mos sooner if not at goal.   Benign paroxysmal positional vertigo due to bilateral vestibular disorder Symptoms are consistent with BPPV.  - Ambulatory referral to Physical Therapy- Vestibular rehab.  - meclizine QHS PRN - flonase nasal spray - hydration.  - if not improved ENt or Neuro referral.       Reviewed expectations re: course of current medical issues.  Discussed self-management of symptoms.  Outlined signs  and symptoms indicating need for more acute intervention.  Patient verbalized understanding and all questions were answered.  Patient received an After-Visit Summary.    Orders Placed This Encounter  Procedures  . Ambulatory referral to Physical Therapy   Meds ordered this encounter  Medications  . fluticasone (FLONASE) 50 MCG/ACT nasal spray    Sig: Place 2 sprays into both nostrils daily.    Dispense:  16 g    Refill:  11  . amLODipine (NORVASC) 2.5 MG tablet    Sig: Take 1 tablet (2.5 mg total) by mouth daily.    Dispense:  90 tablet    Refill:  1  . meclizine (ANTIVERT) 25 MG tablet    Sig: Take 1 tablet (25 mg total) by mouth at bedtime.    Dispense:  30 tablet    Refill:  1    Referral Orders     Ambulatory referral to Physical Therapy   Note is dictated utilizing voice recognition software. Although note has been proof read prior to signing, occasional typographical errors still can be missed. If any questions arise, please do not hesitate to call for verification.   electronically signed by:  Howard Pouch, DO  [place]

## 2020-06-01 NOTE — Patient Instructions (Addendum)
< 135/85 goal for BP.   Continue Flonase.  Start amlodipine 2.5 mg daily.  Antivert before bed can be tried to see if it is helpful.  I place referral to vestibular rehab as well.   How to Perform the Epley Maneuver The Epley maneuver is an exercise that relieves symptoms of vertigo. Vertigo is the feeling that you or your surroundings are moving when they are not. When you feel vertigo, you may feel like the room is spinning and have trouble walking. Dizziness is a little different than vertigo. When you are dizzy, you may feel unsteady or light-headed. You can do this maneuver at home whenever you have symptoms of vertigo. You can do it up to 3 times a day until your symptoms go away. Even though the Epley maneuver may relieve your vertigo for a few weeks, it is possible that your symptoms will return. This maneuver relieves vertigo, but it does not relieve dizziness. What are the risks? If it is done correctly, the Epley maneuver is considered safe. Sometimes it can lead to dizziness or nausea that goes away after a short time. If you develop other symptoms, such as changes in vision, weakness, or numbness, stop doing the maneuver and call your health care provider. How to perform the Epley maneuver 1. Sit on the edge of a bed or table with your back straight and your legs extended or hanging over the edge of the bed or table. 2. Turn your head halfway toward the affected ear or side. 3. Lie backward quickly with your head turned until you are lying flat on your back. You may want to position a pillow under your shoulders. 4. Hold this position for 30 seconds. You may experience an attack of vertigo. This is normal. 5. Turn your head to the opposite direction until your unaffected ear is facing the floor. 6. Hold this position for 30 seconds. You may experience an attack of vertigo. This is normal. Hold this position until the vertigo stops. 7. Turn your whole body to the same side as your  head. Hold for another 30 seconds. 8. Sit back up. You can repeat this exercise up to 3 times a day. Follow these instructions at home:  After doing the Epley maneuver, you can return to your normal activities.  Ask your health care provider if there is anything you should do at home to prevent vertigo. He or she may recommend that you: ? Keep your head raised (elevated) with two or more pillows while you sleep. ? Do not sleep on the side of your affected ear. ? Get up slowly from bed. ? Avoid sudden movements during the day. ? Avoid extreme head movement, like looking up or bending over. Contact a health care provider if:  Your vertigo gets worse.  You have other symptoms, including: ? Nausea. ? Vomiting. ? Headache. Get help right away if:  You have vision changes.  You have a severe or worsening headache or neck pain.  You cannot stop vomiting.  You have new numbness or weakness in any part of your body. Summary  Vertigo is the feeling that you or your surroundings are moving when they are not.  The Epley maneuver is an exercise that relieves symptoms of vertigo.  If the Epley maneuver is done correctly, it is considered safe. You can do it up to 3 times a day. This information is not intended to replace advice given to you by your health care provider. Make sure  you discuss any questions you have with your health care provider. Document Revised: 11/03/2017 Document Reviewed: 10/11/2016 Elsevier Patient Education  2020 [employer].

## 2020-06-02 DIAGNOSIS — M4322 Fusion of spine, cervical region: Secondary | ICD-10-CM | POA: Diagnosis not present

## 2020-06-02 DIAGNOSIS — M6281 Muscle weakness (generalized): Secondary | ICD-10-CM | POA: Diagnosis not present

## 2020-06-04 DIAGNOSIS — M6281 Muscle weakness (generalized): Secondary | ICD-10-CM | POA: Diagnosis not present

## 2020-06-04 DIAGNOSIS — M4322 Fusion of spine, cervical region: Secondary | ICD-10-CM | POA: Diagnosis not present

## 2020-06-08 DIAGNOSIS — H8113 Benign paroxysmal vertigo, bilateral: Secondary | ICD-10-CM | POA: Insufficient documentation

## 2020-06-08 DIAGNOSIS — I10 Essential (primary) hypertension: Secondary | ICD-10-CM | POA: Insufficient documentation

## 2020-06-09 DIAGNOSIS — Z713 Dietary counseling and surveillance: Secondary | ICD-10-CM | POA: Diagnosis not present

## 2020-06-09 DIAGNOSIS — M6281 Muscle weakness (generalized): Secondary | ICD-10-CM | POA: Diagnosis not present

## 2020-06-09 DIAGNOSIS — M4322 Fusion of spine, cervical region: Secondary | ICD-10-CM | POA: Diagnosis not present

## 2020-06-15 ENCOUNTER — Ambulatory Visit: Payer: BC Managed Care – PPO | Admitting: Physical Therapy

## 2020-06-15 DIAGNOSIS — M4322 Fusion of spine, cervical region: Secondary | ICD-10-CM | POA: Diagnosis not present

## 2020-06-15 DIAGNOSIS — M6281 Muscle weakness (generalized): Secondary | ICD-10-CM | POA: Diagnosis not present

## 2020-06-16 DIAGNOSIS — R5383 Other fatigue: Secondary | ICD-10-CM | POA: Diagnosis not present

## 2020-06-16 DIAGNOSIS — N951 Menopausal and female climacteric states: Secondary | ICD-10-CM | POA: Diagnosis not present

## 2020-06-16 DIAGNOSIS — R6882 Decreased libido: Secondary | ICD-10-CM | POA: Diagnosis not present

## 2020-06-16 DIAGNOSIS — Z72820 Sleep deprivation: Secondary | ICD-10-CM | POA: Diagnosis not present

## 2020-06-18 DIAGNOSIS — R6882 Decreased libido: Secondary | ICD-10-CM | POA: Diagnosis not present

## 2020-06-18 DIAGNOSIS — N951 Menopausal and female climacteric states: Secondary | ICD-10-CM | POA: Diagnosis not present

## 2020-06-18 DIAGNOSIS — M4322 Fusion of spine, cervical region: Secondary | ICD-10-CM | POA: Diagnosis not present

## 2020-06-18 DIAGNOSIS — M6281 Muscle weakness (generalized): Secondary | ICD-10-CM | POA: Diagnosis not present

## 2020-06-22 DIAGNOSIS — Z9889 Other specified postprocedural states: Secondary | ICD-10-CM | POA: Diagnosis not present

## 2020-06-23 DIAGNOSIS — M4322 Fusion of spine, cervical region: Secondary | ICD-10-CM | POA: Diagnosis not present

## 2020-06-23 DIAGNOSIS — M6281 Muscle weakness (generalized): Secondary | ICD-10-CM | POA: Diagnosis not present

## 2020-06-30 DIAGNOSIS — M6281 Muscle weakness (generalized): Secondary | ICD-10-CM | POA: Diagnosis not present

## 2020-06-30 DIAGNOSIS — M4322 Fusion of spine, cervical region: Secondary | ICD-10-CM | POA: Diagnosis not present

## 2020-09-14 DIAGNOSIS — N951 Menopausal and female climacteric states: Secondary | ICD-10-CM | POA: Diagnosis not present

## 2020-09-14 DIAGNOSIS — R5383 Other fatigue: Secondary | ICD-10-CM | POA: Diagnosis not present

## 2020-09-21 DIAGNOSIS — Z72820 Sleep deprivation: Secondary | ICD-10-CM | POA: Diagnosis not present

## 2020-09-21 DIAGNOSIS — N951 Menopausal and female climacteric states: Secondary | ICD-10-CM | POA: Diagnosis not present

## 2020-12-05 ENCOUNTER — Other Ambulatory Visit: Payer: Self-pay | Admitting: Family Medicine

## 2020-12-10 DIAGNOSIS — L570 Actinic keratosis: Secondary | ICD-10-CM | POA: Diagnosis not present

## 2020-12-10 DIAGNOSIS — R202 Paresthesia of skin: Secondary | ICD-10-CM | POA: Diagnosis not present

## 2020-12-10 DIAGNOSIS — L821 Other seborrheic keratosis: Secondary | ICD-10-CM | POA: Diagnosis not present

## 2020-12-10 DIAGNOSIS — D485 Neoplasm of uncertain behavior of skin: Secondary | ICD-10-CM | POA: Diagnosis not present

## 2020-12-10 DIAGNOSIS — D229 Melanocytic nevi, unspecified: Secondary | ICD-10-CM | POA: Diagnosis not present

## 2020-12-10 DIAGNOSIS — L820 Inflamed seborrheic keratosis: Secondary | ICD-10-CM | POA: Diagnosis not present

## 2020-12-24 DIAGNOSIS — E559 Vitamin D deficiency, unspecified: Secondary | ICD-10-CM | POA: Diagnosis not present

## 2020-12-24 DIAGNOSIS — N951 Menopausal and female climacteric states: Secondary | ICD-10-CM | POA: Diagnosis not present

## 2020-12-24 DIAGNOSIS — R5383 Other fatigue: Secondary | ICD-10-CM | POA: Diagnosis not present

## 2020-12-24 DIAGNOSIS — E7800 Pure hypercholesterolemia, unspecified: Secondary | ICD-10-CM | POA: Diagnosis not present

## 2020-12-28 DIAGNOSIS — I10 Essential (primary) hypertension: Secondary | ICD-10-CM | POA: Diagnosis not present

## 2020-12-28 DIAGNOSIS — E559 Vitamin D deficiency, unspecified: Secondary | ICD-10-CM | POA: Diagnosis not present

## 2020-12-28 DIAGNOSIS — F5101 Primary insomnia: Secondary | ICD-10-CM | POA: Diagnosis not present

## 2020-12-28 DIAGNOSIS — Z6832 Body mass index (BMI) 32.0-32.9, adult: Secondary | ICD-10-CM | POA: Diagnosis not present

## 2021-01-05 ENCOUNTER — Other Ambulatory Visit: Payer: Self-pay | Admitting: Family Medicine

## 2021-01-06 ENCOUNTER — Ambulatory Visit: Payer: BC Managed Care – PPO | Admitting: Family Medicine

## 2021-01-06 ENCOUNTER — Other Ambulatory Visit: Payer: Self-pay

## 2021-01-06 ENCOUNTER — Encounter: Payer: Self-pay | Admitting: Family Medicine

## 2021-01-06 VITALS — BP 138/87 | HR 97 | Temp 98.3°F | Ht 69.0 in | Wt 219.0 lb

## 2021-01-06 DIAGNOSIS — E669 Obesity, unspecified: Secondary | ICD-10-CM | POA: Insufficient documentation

## 2021-01-06 DIAGNOSIS — E782 Mixed hyperlipidemia: Secondary | ICD-10-CM

## 2021-01-06 DIAGNOSIS — Z5320 Procedure and treatment not carried out because of patient's decision for unspecified reasons: Secondary | ICD-10-CM

## 2021-01-06 DIAGNOSIS — I10 Essential (primary) hypertension: Secondary | ICD-10-CM | POA: Diagnosis not present

## 2021-01-06 MED ORDER — AMLODIPINE BESYLATE 5 MG PO TABS: 2.5000 mg | ORAL_TABLET | Freq: Every day | ORAL | 1 refills | Status: DC

## 2021-01-06 NOTE — Progress Notes (Signed)
This visit occurred during the SARS-CoV-2 public health emergency.  Safety protocols were in place, including screening questions prior to the visit, additional usage of staff PPE, and extensive cleaning of exam room while observing appropriate contact time as indicated for disinfecting solutions.    Tammy Estes , 1976-07-18, 45 y.o., female MRN: [id number] Patient Care Team    Relationship Specialty Notifications Start End  Natalia Leatherwood, DO PCP - General Family Medicine  10/02/19   Karleen Dolphin  Obstetrics and Gynecology  10/02/19   Estill Bamberg, MD Consulting Physician Orthopedic Surgery  05/05/20     Chief Complaint  Patient presents with  . Follow-up    [facility];      Subjective: Pt presents for an OV with  HTN/HLD/statin declined: She has not  needed the clonidine. She reports compliance with amlodipine  2.5 mg and most BP at home are > 140/90. She is working on her diet and exercise. She declines statin and has been counseled . She understands the risk, but does not want to take a medicine daily at this time.  She has had a repeat cholesterol panel at another provider with a LDL 195 12/26/2020.FHX stroke and heart disease.  A1c was 5.1.   Lipid Panel     Component Value Date/Time   CHOL 281 (H) 10/02/2019 1206   TRIG 61.0 10/02/2019 1206   HDL 67.60 10/02/2019 1206   CHOLHDL 4 10/02/2019 1206   VLDL 12.2 10/02/2019 1206   LDLCALC 201 (H) 10/02/2019 1206     Depression screen [facility] 2/9 01/06/2021 02/18/2020 10/02/2019  Decreased Interest 0 3 0  Down, Depressed, Hopeless 0 3 0  PHQ - 2 Score 0 6 0  Altered sleeping - 3 -  Tired, decreased energy - 3 -  Change in appetite - 3 -  Feeling bad or failure about yourself  - 3 -  Trouble concentrating - 3 -  Moving slowly or fidgety/restless - 3 -  Suicidal thoughts - 0 -  PHQ-9 Score - 24 -  Difficult doing work/chores - Very difficult -    No Known Allergies Social History   Social History Narrative    Marital status/children/pets: married, 2 children.    Education/employment: employeed, realtor   Safety:      -smoke alarm in the home:Yes     - wears seatbelt: Yes     - Feels safe in their relationships: Yes   Past Medical History:  Diagnosis Date  . Allergy   . Arthritis   . Chicken pox   . Elevated bilirubin 10/2019   with fasting labs only>> rpt normal.   . History of frequent urinary tract infections   . Hypertension   . Vitamin D deficiency    Past Surgical History:  Procedure Laterality Date  . ABDOMINAL HYSTERECTOMY  2015   partial for adenomyosis and endometriosis  . C5-C7 ACDF  03/31/2020   Dr. Estill Bamberg  . TONSILLECTOMY AND ADENOIDECTOMY  1996  . WISDOM TOOTH EXTRACTION     Family History  Problem Relation Age of Onset  . Stroke Father   . Heart disease Father   . Dementia Father   . Hypertension Father   . Hyperlipidemia Father   . Diabetes Father   . Autism Brother   . Hyperlipidemia Maternal Grandmother   . Hypertension Maternal Grandmother   . Stroke Maternal Grandfather   . Parkinson's disease Maternal Grandfather   . Pancreatic cancer Maternal Grandfather   .  Stroke Paternal Grandmother   . Heart attack Paternal Grandmother    Allergies as of 01/06/2021   No Known Allergies     Medication List       Accurate as of January 06, 2021  2:44 PM. If you have any questions, ask your nurse or doctor.        acetaminophen 325 MG tablet Commonly known as: TYLENOL Take 650 mg by mouth every 6 (six) hours as needed.   amLODipine 5 MG tablet Commonly known as: NORVASC Take 0.5 tablets (2.5 mg total) by mouth daily. What changed: medication strength Changed by: Felix Pacini, DO   cloNIDine 0.1 MG tablet Commonly known as: CATAPRES 0.1 mg TID PRN for elevated BP   Collagen Hydrolysate (Bovine) Powd by Does not apply route.   fluticasone 50 MCG/ACT nasal spray Commonly known as: FLONASE Place 2 sprays into both nostrils daily.   HAIR  VITAMINS PO Take 1 tablet by mouth.   Magnesium 200 MG Tabs Take 1 tablet by mouth.   meclizine 25 MG tablet Commonly known as: ANTIVERT Take 1 tablet (25 mg total) by mouth at bedtime.   methocarbamol 500 MG tablet Commonly known as: ROBAXIN methocarbamol 500 mg tablet   progesterone 200 MG capsule Commonly known as: PROMETRIUM Take 225 mg by mouth daily.   Testosterone 100 MG Pllt 125 mg by Implant route.   Vitamin D 125 MCG (5000 UT) Caps Take 1 tablet by mouth.       All past medical history, surgical history, allergies, family history, immunizations andmedications were updated in the EMR today and reviewed under the history and medication portions of their EMR.     ROS: Negative, with the exception of above mentioned in HPI   Objective:  BP 138/87   Pulse 97   Temp 98.3 F (36.8 C) (Oral)   Ht 5\' 9"  (1.753 m)   Wt 219 lb (99.3 kg)   SpO2 100%   BMI 32.34 kg/m  Body mass index is 32.34 kg/m. Gen: Afebrile. No acute distress.  HENT: AT. [place].  Eyes:Pupils Equal Round Reactive to light, Extraocular movements intact,  Conjunctiva without redness, discharge or icterus. CV: RRR no murmur, no edema, +2/4 P posterior tibialis pulses Chest: CTAB, no wheeze or crackles Abd: Soft. NTND. BS presentn. no Masses palpated.  Skin: no rashes, purpura or petechiae.  Neuro: Normal gait. PERLA. EOMi. Alert. Oriented x3 Psych: Normal affect, dress and demeanor. Normal speech. Normal thought content and judgment.    No exam data present No results found. No results found for this or any previous visit (from the past 24 hour(s)).  Assessment/Plan: Tammy Estes is a 45 y.o. female present for OV for  Essential hypertension/HLD/obesity Above goal.  Increase amlodipine to 5 mg qd Diet and exercise modifications: she started a few weeks ago.  Declines statin.  continue to monitor BP: Goal < 135/85 F/u 5.5 mos sooner if not at goal.     Reviewed expectations re:  course of current medical issues.  Discussed self-management of symptoms.  Outlined signs and symptoms indicating need for more acute intervention.  Patient verbalized understanding and all questions were answered.  Patient received an After-Visit Summary.    No orders of the defined types were placed in this encounter.  Meds ordered this encounter  Medications  . amLODipine (NORVASC) 5 MG tablet    Sig: Take 0.5 tablets (2.5 mg total) by mouth daily.    Dispense:  90 tablet  Refill:  1   Referral Orders  No referral(s) requested today     Note is dictated utilizing voice recognition software. Although note has been proof read prior to signing, occasional typographical errors still can be missed. If any questions arise, please do not hesitate to call for verification.   electronically signed by:  Howard Pouch, DO  [place]

## 2021-01-06 NOTE — Patient Instructions (Signed)
I have refilled your blood pressure medications.  Next appt mid-July.

## 2021-01-07 ENCOUNTER — Telehealth: Payer: Self-pay

## 2021-01-07 NOTE — Telephone Encounter (Signed)
Patient has questions regarding med that was prescribed by Dr. Claiborne Billings earlier today. amLODipine (NORVASC) 5 MG tablet [[id number]]   Dr. Claiborne Billings doubled her dose but incorrect prescription was sent to pharmacy. She only received 15 d/s  Please send corrected prescription for 90 d/s.  She will not be able to fill new/corrected script in about 10-12 days per insurance.  Patient is aware will not be completed today.  She said to leave voicemail when corrected 2/4   [facility]

## 2021-01-08 MED ORDER — AMLODIPINE BESYLATE 5 MG PO TABS: 5.0000 mg | ORAL_TABLET | Freq: Every day | ORAL | 1 refills | Status: DC

## 2021-01-08 NOTE — Telephone Encounter (Signed)
Sent new Rx to pharmacy with correct instructions. Insurance will not allow pt to pick up for a few days.

## 2021-02-08 ENCOUNTER — Encounter: Payer: Self-pay | Admitting: Family Medicine

## 2021-02-08 IMAGING — CT CT HEAD W/O CM
3 series · 15 of 47 positions shown, 18 images · non-contrast
Comparison: None.

CLINICAL DATA: Headache.

EXAM:
CT HEAD WITHOUT CONTRAST
TECHNIQUE: Contiguous axial images were obtained from the base of the skull
through the vertex without intravenous contrast.

[Series 2: head wo · axial · 0.44mm/px · z∈[+1252,+1388]mm · 9 of 33 slices shown, 12 images]
[im 3/33  brain]
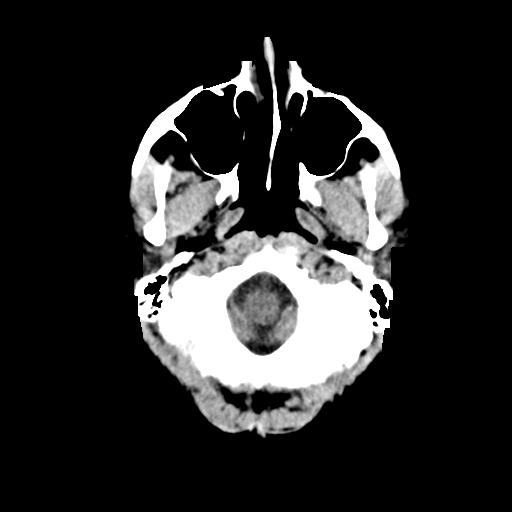
[im 3/33  bone]
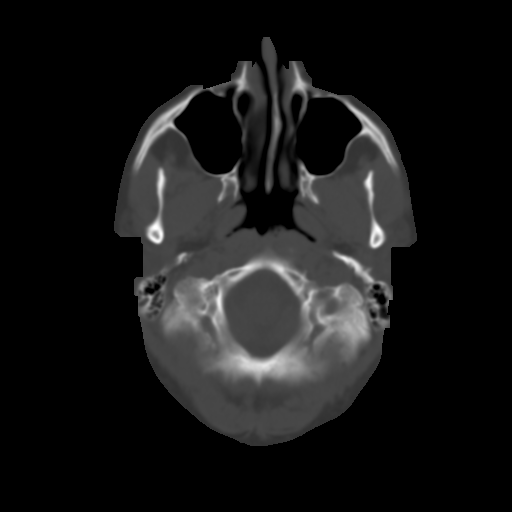
[im 6/33  brain]
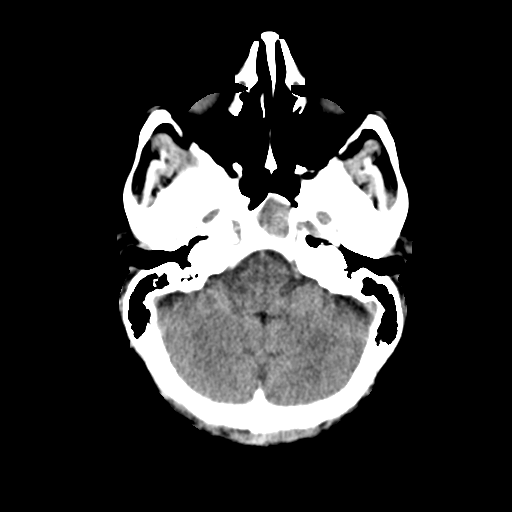
[im 9/33  brain]
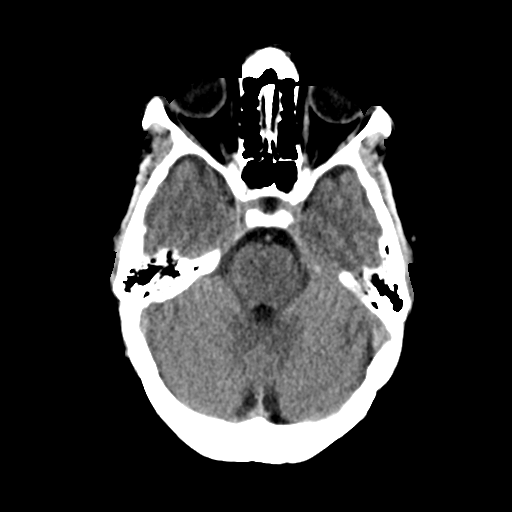
[im 13/33  brain]
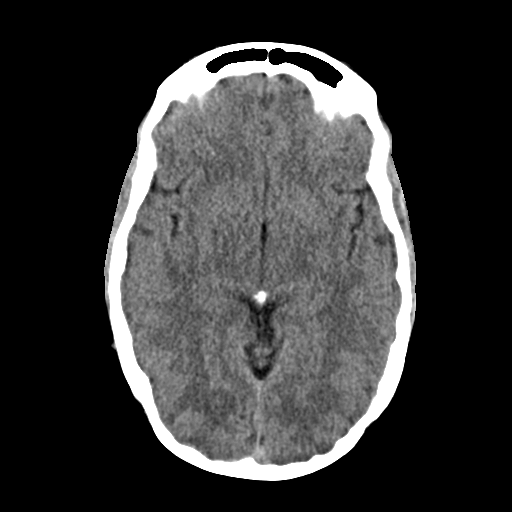
[im 17/33  brain]
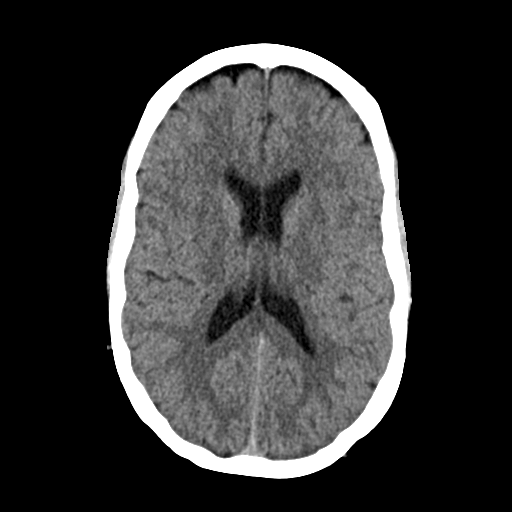
[im 17/33  bone]
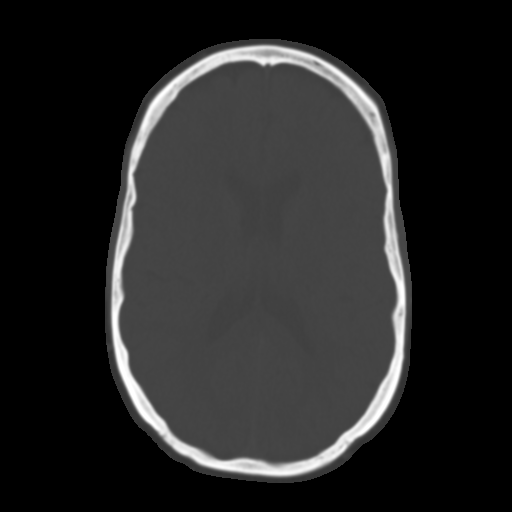
[im 20/33  brain]
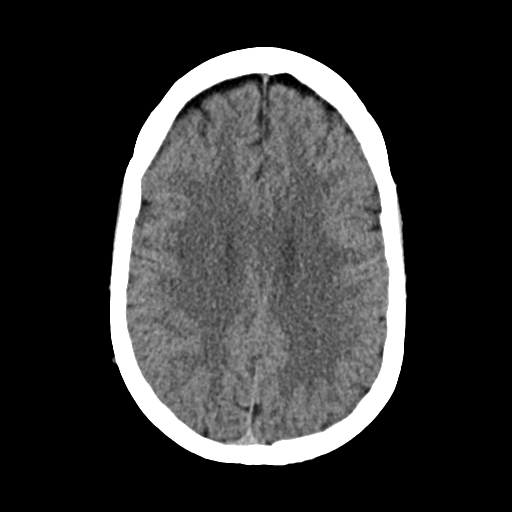
[im 24/33  brain]
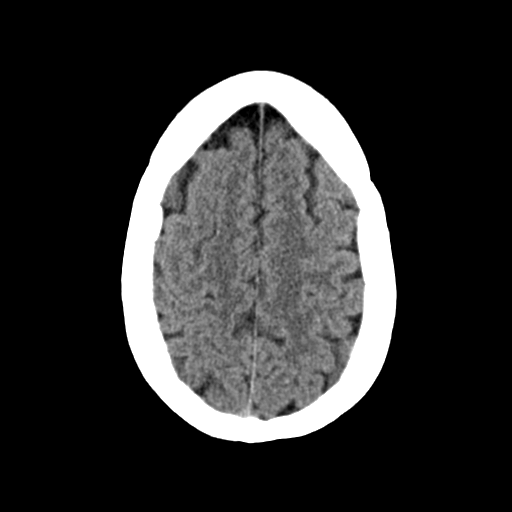
[im 27/33  brain]
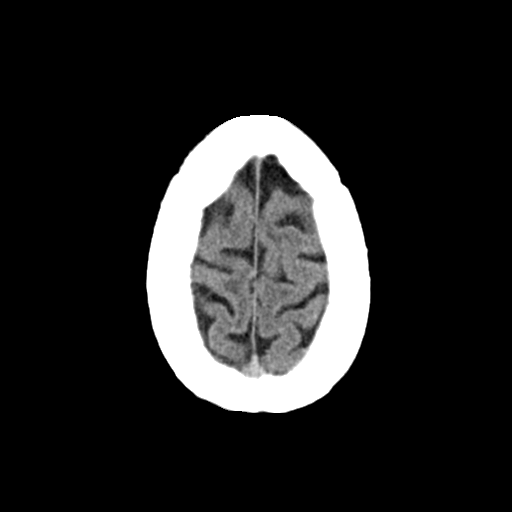
[im 30/33  brain]
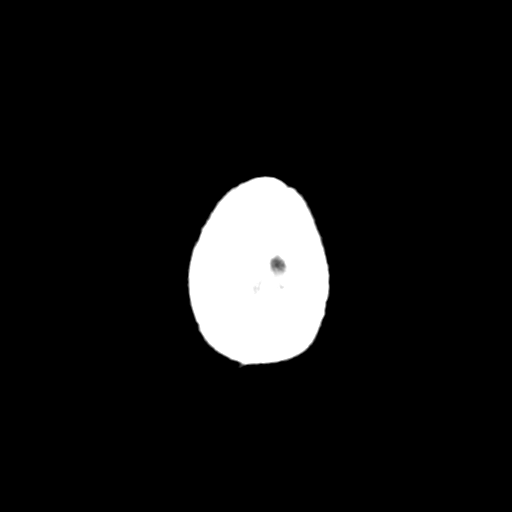
[im 30/33  bone]
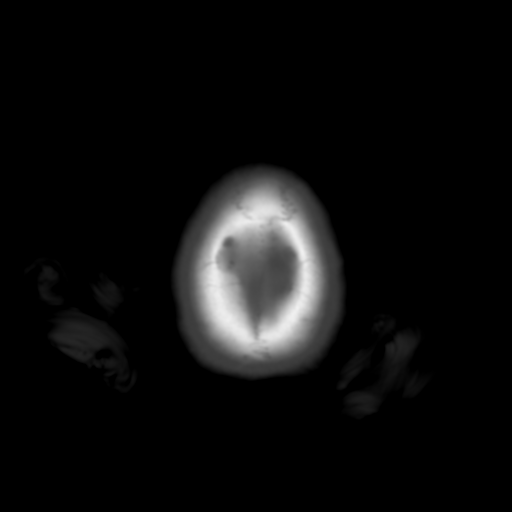

[Series 4: coronal soft · coronal · 0.29mm/px · 3 of 68 slices shown]
[im 23/68  brain]
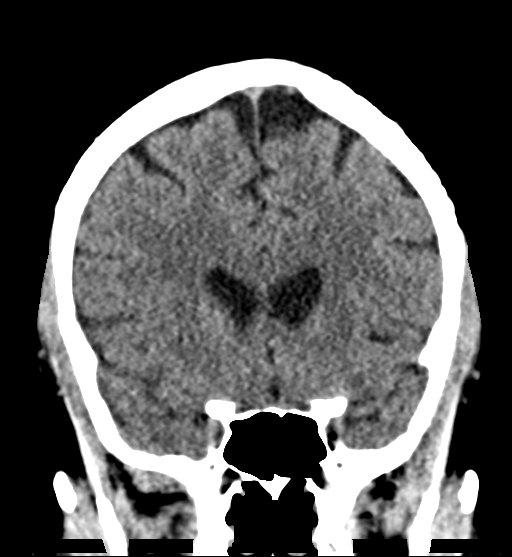
[im 30/68  brain]
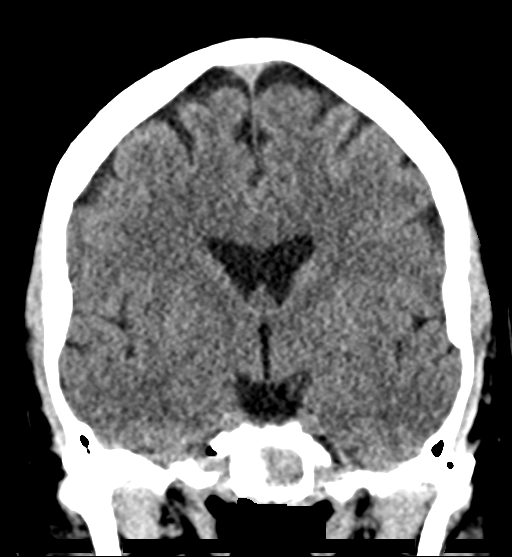
[im 38/68  brain]
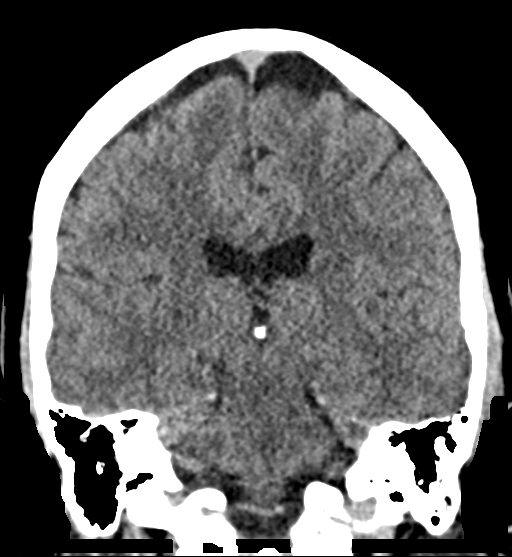

[Series 5: sag soft · sagittal · 0.31mm/px · 3 of 51 slices shown]
[im 17/51  brain]
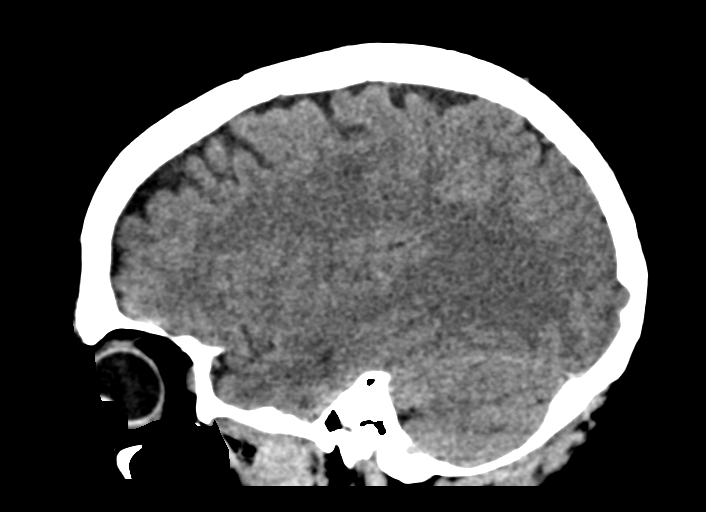
[im 26/51  brain]
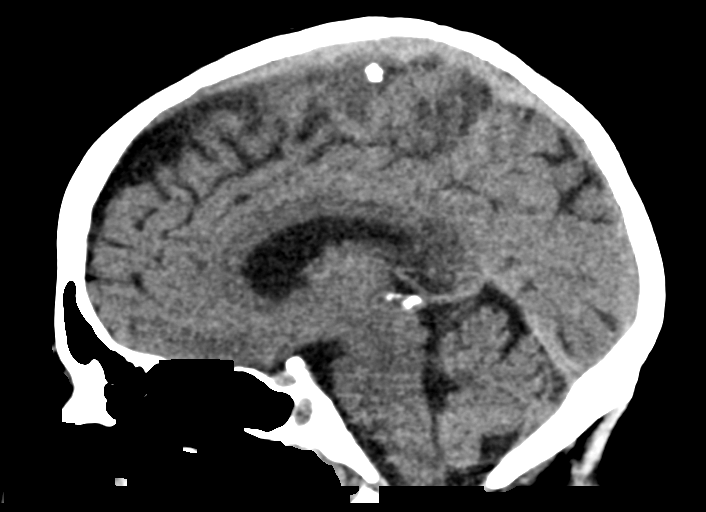
[im 34/51  brain]
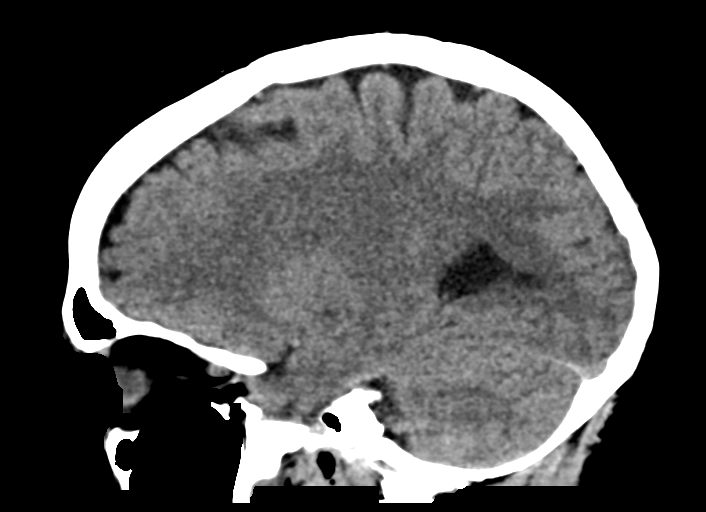

[15 of 47 positions shown; findings below may reference images not displayed]

FINDINGS: Brain: No evidence of acute infarction, hemorrhage, hydrocephalus,
extra-axial collection or mass lesion/mass effect.

Vascular: No hyperdense vessel or unexpected calcification.

Skull: Normal. Negative for fracture or focal lesion.

Sinuses/Orbits: Complete opacification of sphenoid sinus is noted
suggesting mucocele.

Other: None.
IMPRESSION: Complete opacification of sphenoid sinus is noted suggesting
mucocele. No acute intracranial abnormality seen.

## 2021-02-15 ENCOUNTER — Other Ambulatory Visit: Payer: Self-pay

## 2021-02-15 ENCOUNTER — Ambulatory Visit: Payer: BC Managed Care – PPO | Admitting: Family Medicine

## 2021-02-15 ENCOUNTER — Encounter: Payer: Self-pay | Admitting: Family Medicine

## 2021-02-15 VITALS — BP 146/83 | HR 86 | Temp 98.6°F | Ht 69.0 in | Wt 202.0 lb

## 2021-02-15 DIAGNOSIS — I83899 Varicose veins of unspecified lower extremities with other complications: Secondary | ICD-10-CM

## 2021-02-15 NOTE — Progress Notes (Signed)
This visit occurred during the SARS-CoV-2 public health emergency.  Safety protocols were in place, including screening questions prior to the visit, additional usage of staff PPE, and extensive cleaning of exam room while observing appropriate contact time as indicated for disinfecting solutions.    Tammy Estes , 06-Jun-1976, 45 y.o., female MRN: [id number] Patient Care Team    Relationship Specialty Notifications Start End  Natalia Leatherwood, DO PCP - General Family Medicine  10/02/19   Karleen Dolphin  Obstetrics and Gynecology  10/02/19   Estill Bamberg, MD Consulting Physician Orthopedic Surgery  05/05/20     Chief Complaint  Patient presents with  . referral request     Leg swelling; see mychart message; pt keeps body measurements;     Subjective: Pt presents for an OV with complaints of swelling andd tenderness of lower extremities. Pt has tried compression stockings to ease their symptoms. She reports she has lost weight and legs remain the same size. She endorses swelling nad discomfort in her legs and would like to discuss options with [facility] on lipedema.   Depression screen [facility] 2/9 01/06/2021 02/18/2020 10/02/2019  Decreased Interest 0 3 0  Down, Depressed, Hopeless 0 3 0  PHQ - 2 Score 0 6 0  Altered sleeping - 3 -  Tired, decreased energy - 3 -  Change in appetite - 3 -  Feeling bad or failure about yourself  - 3 -  Trouble concentrating - 3 -  Moving slowly or fidgety/restless - 3 -  Suicidal thoughts - 0 -  PHQ-9 Score - 24 -  Difficult doing work/chores - Very difficult -    No Known Allergies Social History   Social History Narrative   Marital status/children/pets: married, 2 children.    Education/employment: employeed, realtor   Safety:      -smoke alarm in the home:Yes     - wears seatbelt: Yes     - Feels safe in their relationships: Yes   Past Medical History:  Diagnosis Date  . Allergy   . Arthritis   . Chicken pox    . Elevated bilirubin 10/2019   with fasting labs only>> rpt normal.   . History of frequent urinary tract infections   . Hypertension   . Vitamin D deficiency    Past Surgical History:  Procedure Laterality Date  . ABDOMINAL HYSTERECTOMY  2015   partial for adenomyosis and endometriosis  . C5-C7 ACDF  03/31/2020   Dr. Estill Bamberg  . TONSILLECTOMY AND ADENOIDECTOMY  1996  . WISDOM TOOTH EXTRACTION     Family History  Problem Relation Age of Onset  . Stroke Father   . Heart disease Father   . Dementia Father   . Hypertension Father   . Hyperlipidemia Father   . Diabetes Father   . Autism Brother   . Hyperlipidemia Maternal Grandmother   . Hypertension Maternal Grandmother   . Stroke Maternal Grandfather   . Parkinson's disease Maternal Grandfather   . Pancreatic cancer Maternal Grandfather   . Stroke Paternal Grandmother   . Heart attack Paternal Grandmother    Allergies as of 02/15/2021   No Known Allergies     Medication List       Accurate as of February 15, 2021 11:30 AM. If you have any questions, ask your nurse or doctor.        STOP taking these medications   cloNIDine 0.1 MG tablet Commonly known as: CATAPRES Stopped  by: Felix Pacini, DO     TAKE these medications   acetaminophen 325 MG tablet Commonly known as: TYLENOL Take 650 mg by mouth every 6 (six) hours as needed.   amLODipine 5 MG tablet Commonly known as: NORVASC Take 1 tablet (5 mg total) by mouth daily.   Collagen Hydrolysate (Bovine) Powd by Does not apply route.   fluticasone 50 MCG/ACT nasal spray Commonly known as: FLONASE Place 2 sprays into both nostrils daily. What changed:   when to take this  reasons to take this   HAIR VITAMINS PO Take 1 tablet by mouth.   Magnesium 500 MG Tabs Take 1 tablet by mouth.   meclizine 25 MG tablet Commonly known as: ANTIVERT Take 1 tablet (25 mg total) by mouth at bedtime.   methocarbamol 500 MG tablet Commonly known as:  ROBAXIN daily as needed.   progesterone 200 MG capsule Commonly known as: PROMETRIUM Take 100 mg by mouth daily.   Testosterone 100 MG Pllt 125 mg by Implant route.   Vitamin D 125 MCG (5000 UT) Caps Take 1 tablet by mouth.       All past medical history, surgical history, allergies, family history, immunizations andmedications were updated in the EMR today and reviewed under the history and medication portions of their EMR.     ROS: Negative, with the exception of above mentioned in HPI   Objective:  BP (!) 146/83   Pulse 86   Temp 98.6 F (37 C) (Oral)   Ht 5\' 9"  (1.753 m)   Wt 202 lb (91.6 kg)   SpO2 98%   BMI 29.83 kg/m  Body mass index is 29.83 kg/m. Gen: Afebrile. No acute distress. Nontoxic in appearance, well developed, well nourished.  HENT: AT. [place].  Eyes:Pupils Equal Round Reactive to light, Extraocular movements intact,  Conjunctiva without redness, discharge or icterus. LE/Skin: no edema or erythema. Spider veins and mild varicose veins bilaterally.  Neuro: Normal gait. PERLA. EOMi. Alert. Oriented x3  Psych: Normal affect, dress and demeanor. Normal speech. Normal thought content and judgment.  No exam data present No results found. No results found for this or any previous visit (from the past 24 hour(s)).  Assessment/Plan: Tammy Estes is a 45 y.o. female present for OV for  Symptomatic spider varicose vein Encouraged compression stockings and referred to [facility] - Ambulatory referral to Vascular Surgery   Reviewed expectations re: course of current medical issues.  Discussed self-management of symptoms.  Outlined signs and symptoms indicating need for more acute intervention.  Patient verbalized understanding and all questions were answered.  Patient received an After-Visit Summary.    Orders Placed This Encounter  Procedures  . Ambulatory referral to Vascular Surgery   No orders of the defined types were placed in this  encounter.   Referral Orders     Ambulatory referral to Vascular Surgery   Note is dictated utilizing voice recognition software. Although note has been proof read prior to signing, occasional typographical errors still can be missed. If any questions arise, please do not hesitate to call for verification.   electronically signed by:  54, DO  [facility] [place]

## 2021-02-15 NOTE — Patient Instructions (Addendum)
I have referred you to [facility].

## 2021-03-03 ENCOUNTER — Encounter (INDEPENDENT_AMBULATORY_CARE_PROVIDER_SITE_OTHER): Payer: Self-pay | Admitting: Nurse Practitioner

## 2021-03-03 ENCOUNTER — Ambulatory Visit (INDEPENDENT_AMBULATORY_CARE_PROVIDER_SITE_OTHER): Payer: BC Managed Care – PPO | Admitting: Nurse Practitioner

## 2021-03-03 ENCOUNTER — Other Ambulatory Visit: Payer: Self-pay

## 2021-03-03 VITALS — BP 87/61 | HR 111 | Ht 68.0 in | Wt 202.0 lb

## 2021-03-03 DIAGNOSIS — M7989 Other specified soft tissue disorders: Secondary | ICD-10-CM

## 2021-03-03 DIAGNOSIS — I10 Essential (primary) hypertension: Secondary | ICD-10-CM

## 2021-03-10 ENCOUNTER — Encounter (INDEPENDENT_AMBULATORY_CARE_PROVIDER_SITE_OTHER): Payer: BC Managed Care – PPO

## 2021-03-10 ENCOUNTER — Ambulatory Visit (INDEPENDENT_AMBULATORY_CARE_PROVIDER_SITE_OTHER): Payer: BC Managed Care – PPO | Admitting: Nurse Practitioner

## 2021-03-15 NOTE — Progress Notes (Signed)
Subjective:    Patient ID: Tammy Estes, female    DOB: 1976-11-07, 45 y.o.   MRN: [id number] Chief Complaint  Patient presents with  . New Patient (Initial Visit)    [place]. Symptomatic spider varicose vein    Tammy Estes is a 45 year old woman that presents today for concern for swelling of her lower extremities.  Patient is not overly concerned about spider veins however she is concerned that she may have a form of lipedema.  The patient notes that she follows a healthy diet as well as exercises and despite losing weight within her face and midsection she continues to have large arms and legs.  She denies any systemic causes of leg swelling such as issues with her heart, kidney disease or issues with her liver.  She denies any known chronic venous insufficiency or edema.  She denies any fever or chills.  The patient notes that her lower extremities swell if her legs are dependent for any extended time.  She also notices that her legs are extremely tender as well.  She also wears medical grade compression socks on a regular consistent basis in addition to elevation exercise noted previously.   Review of Systems  Cardiovascular: Positive for leg swelling.  All other systems reviewed and are negative.      Objective:   Physical Exam Vitals reviewed.  HENT:     Head: Normocephalic.  Cardiovascular:     Rate and Rhythm: Normal rate.  Pulmonary:     Effort: Pulmonary effort is normal.  Musculoskeletal:     Right lower leg: 1+ Edema present.     Left lower leg: 1+ Edema present.  Skin:    Comments: Scattered spider varicosities bilaterally.  Neurological:     Mental Status: She is alert and oriented to person, place, and time.  Psychiatric:        Mood and Affect: Mood normal.        Behavior: Behavior normal.        Thought Content: Thought content normal.        Judgment: Judgment normal.     BP (!) 87/61   Pulse (!) 111   Ht 5\' 8"  (1.727 m)   Wt 202 lb (91.6 kg)    BMI 30.71 kg/m   Past Medical History:  Diagnosis Date  . Allergy   . Arthritis   . Chicken pox   . Elevated bilirubin 10/2019   with fasting labs only>> rpt normal.   . History of frequent urinary tract infections   . Hypertension   . Vitamin D deficiency     Social History   Socioeconomic History  . Marital status: Married    Spouse name: Not on file  . Number of children: Not on file  . Years of education: Not on file  . Highest education level: Not on file  Occupational History  . Not on file  Tobacco Use  . Smoking status: Never Smoker  . Smokeless tobacco: Never Used  Vaping Use  . Vaping Use: Never used  Substance and Sexual Activity  . Alcohol use: Yes    Alcohol/week: 1.0 standard drink    Types: 1 Glasses of wine per week    Comment: Socailly   . Drug use: Never  . Sexual activity: Yes    Partners: Male    Birth control/protection: Other-see comments    Comment: hysterectomy  Other Topics Concern  . Not on file  Social History Narrative   Marital  status/children/pets: married, 2 children.    Education/employment: employeed, realtor   Safety:      -smoke alarm in the home:Yes     - wears seatbelt: Yes     - Feels safe in their relationships: Yes   Social Determinants of Health   Financial Resource Strain: Not on file  Food Insecurity: Not on file  Transportation Needs: Not on file  Physical Activity: Not on file  Stress: Not on file  Social Connections: Not on file  Intimate Partner Violence: Not on file    Past Surgical History:  Procedure Laterality Date  . ABDOMINAL HYSTERECTOMY  2015   partial for adenomyosis and endometriosis  . C5-C7 ACDF  03/31/2020   Dr. Estill Bamberg  . TONSILLECTOMY AND ADENOIDECTOMY  1996  . WISDOM TOOTH EXTRACTION      Family History  Problem Relation Age of Onset  . Stroke Father   . Heart disease Father   . Dementia Father   . Hypertension Father   . Hyperlipidemia Father   . Diabetes Father   .  Autism Brother   . Hyperlipidemia Maternal Grandmother   . Hypertension Maternal Grandmother   . Stroke Maternal Grandfather   . Parkinson's disease Maternal Grandfather   . Pancreatic cancer Maternal Grandfather   . Stroke Paternal Grandmother   . Heart attack Paternal Grandmother     No Known Allergies  CBC Latest Ref Rng & Units 04/06/2020 10/02/2019  WBC 4.0 - 10.5 K/uL 8.9 7.1  Hemoglobin 12.0 - 15.0 g/dL 15.5(H) 14.9  Hematocrit 36.0 - 46.0 % 46.1(H) 44.3  Platelets 150 - 400 K/uL 337 278.0      CMP     Component Value Date/Time   NA 139 04/06/2020 1541   K 3.8 04/06/2020 1541   CL 104 04/06/2020 1541   CO2 23 04/06/2020 1541   GLUCOSE 103 (H) 04/06/2020 1541   BUN 8 04/06/2020 1541   CREATININE 0.77 04/06/2020 1541   CALCIUM 9.4 04/06/2020 1541   PROT 8.2 (H) 04/06/2020 1541   ALBUMIN 4.7 04/06/2020 1541   AST 22 04/06/2020 1541   ALT 22 04/06/2020 1541   ALKPHOS 56 04/06/2020 1541   BILITOT 0.7 04/06/2020 1541   GFRNONAA >60 04/06/2020 1541   GFRAA >60 04/06/2020 1541     No results found.     Assessment & Plan:   1. Leg swelling Discussion with the patient in regards to lower extremity leg swelling and the different causes.  There are systemic causes which the patient currently does not seem to have issues with.  There are also causes such as venous insufficiency as well as lymphedema.  It is also certainly possible that the patient has lipedema.  We will first obtain a bilateral lower extremity venous reflux to rule out any venous disease as a possible cause of the swelling.  The patient is advised to continue with utilization of compression elevation, healthy diet as well as exercise. We will have the patient back at her convenience for noninvasive studies.  2. Essential hypertension Continue antihypertensive medications as already ordered, these medications have been reviewed and there are no changes at this time.    Current Outpatient Medications on  File Prior to Visit  Medication Sig Dispense Refill  . acetaminophen (TYLENOL) 325 MG tablet Take 650 mg by mouth every 6 (six) hours as needed.    Marland Kitchen amLODipine (NORVASC) 5 MG tablet Take 1 tablet (5 mg total) by mouth daily. 90 tablet 1  .  Cholecalciferol (VITAMIN D) 125 MCG (5000 UT) CAPS Take 1 tablet by mouth.    . Collagen Hydrolysate, Bovine, POWD by Does not apply route.    . fluticasone (FLONASE) 50 MCG/ACT nasal spray Place 2 sprays into both nostrils daily. (Patient taking differently: Place 2 sprays into both nostrils as needed.) 16 g 11  . Magnesium 500 MG TABS Take 1 tablet by mouth.    . meclizine (ANTIVERT) 25 MG tablet Take 1 tablet (25 mg total) by mouth at bedtime. 30 tablet 1  . methocarbamol (ROBAXIN) 500 MG tablet daily as needed.    . Multiple Vitamins-Minerals (HAIR VITAMINS PO) Take 1 tablet by mouth.    . progesterone (PROMETRIUM) 200 MG capsule Take 100 mg by mouth daily.    . Testosterone 100 MG PLLT 125 mg by Implant route.     No current facility-administered medications on file prior to visit.    There are no Patient Instructions on file for this visit. No follow-ups on file.   Georgiana Spinner, NP

## 2021-03-16 ENCOUNTER — Encounter (INDEPENDENT_AMBULATORY_CARE_PROVIDER_SITE_OTHER): Payer: Self-pay | Admitting: Nurse Practitioner

## 2021-03-30 DIAGNOSIS — R5383 Other fatigue: Secondary | ICD-10-CM | POA: Diagnosis not present

## 2021-03-30 DIAGNOSIS — N951 Menopausal and female climacteric states: Secondary | ICD-10-CM | POA: Diagnosis not present

## 2021-06-25 ENCOUNTER — Other Ambulatory Visit: Payer: Self-pay

## 2021-06-25 MED ORDER — AMLODIPINE BESYLATE 5 MG PO TABS: 5.0000 mg | ORAL_TABLET | Freq: Every day | ORAL | 0 refills | Status: AC

## 2021-07-12 ENCOUNTER — Other Ambulatory Visit: Payer: Self-pay

## 2022-10-03 ENCOUNTER — Encounter (INDEPENDENT_AMBULATORY_CARE_PROVIDER_SITE_OTHER): Payer: Self-pay
